# Patient Record
Sex: Male | Born: 1945 | Race: White | Hispanic: No
Health system: Southern US, Community
[De-identification: ages and names within clinical notes are randomized; demographics above are authoritative.]

## PROBLEM LIST (undated history)

## (undated) DIAGNOSIS — I498 Other specified cardiac arrhythmias: Secondary | ICD-10-CM

## (undated) DIAGNOSIS — I10 Essential (primary) hypertension: Secondary | ICD-10-CM

## (undated) DIAGNOSIS — R55 Syncope and collapse: Secondary | ICD-10-CM

## (undated) DIAGNOSIS — R600 Localized edema: Secondary | ICD-10-CM

## (undated) HISTORY — DX: Syncope and collapse: R55

## (undated) HISTORY — DX: Other specified cardiac arrhythmias: I49.8

## (undated) HISTORY — DX: Essential (primary) hypertension: I10

## (undated) HISTORY — DX: Localized edema: R60.0

---

## 2008-02-04 ENCOUNTER — Ambulatory Visit (HOSPITAL_COMMUNITY): Admission: RE | Admit: 2008-02-04 | Discharge: 2008-02-04 | Payer: Self-pay | Admitting: Internal Medicine

## 2008-02-11 ENCOUNTER — Ambulatory Visit (HOSPITAL_COMMUNITY): Admission: RE | Admit: 2008-02-11 | Discharge: 2008-02-11 | Payer: Self-pay | Admitting: Internal Medicine

## 2009-03-21 ENCOUNTER — Encounter (INDEPENDENT_AMBULATORY_CARE_PROVIDER_SITE_OTHER): Payer: Self-pay | Admitting: Internal Medicine

## 2009-03-21 ENCOUNTER — Ambulatory Visit: Admission: RE | Admit: 2009-03-21 | Discharge: 2009-03-21 | Payer: Self-pay | Admitting: Internal Medicine

## 2009-03-21 ENCOUNTER — Ambulatory Visit: Payer: Self-pay | Admitting: Vascular Surgery

## 2010-02-12 ENCOUNTER — Ambulatory Visit (HOSPITAL_COMMUNITY): Admission: RE | Admit: 2010-02-12 | Discharge: 2010-02-12 | Payer: Self-pay | Admitting: Internal Medicine

## 2010-12-19 IMAGING — CR DG ELBOW COMPLETE 3+V*R*
4 series · 4 of 4 positions shown · non-contrast
Comparison: None.

CLINICAL DATA: Swelling right elbow pain

RIGHT ELBOW - COMPLETE 3+ VIEW

[view not recorded (1 of 4)]
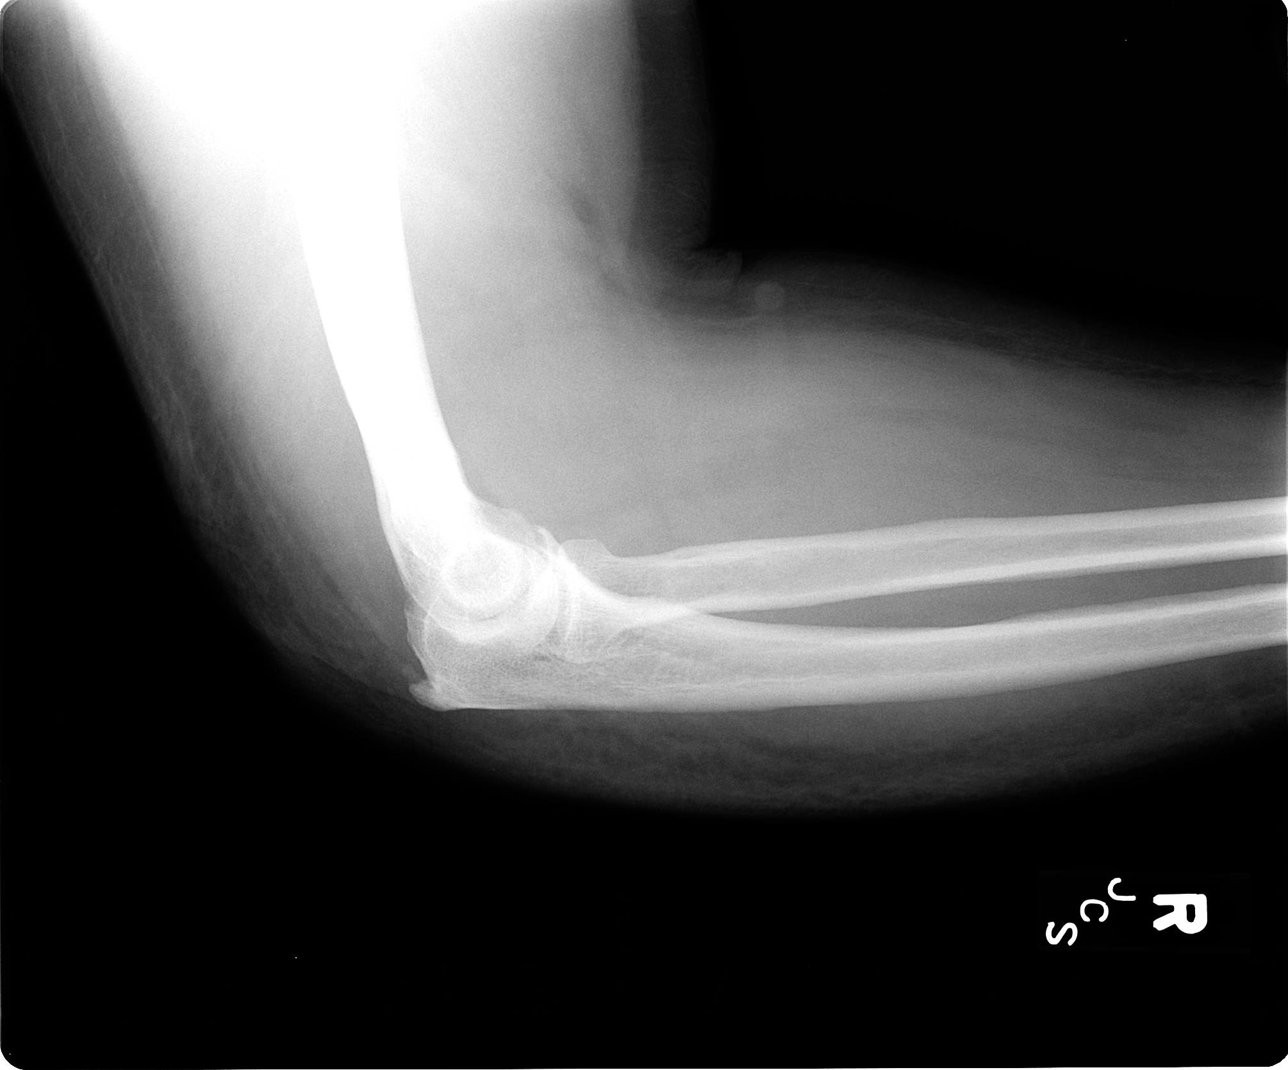

[view not recorded (2 of 4)]
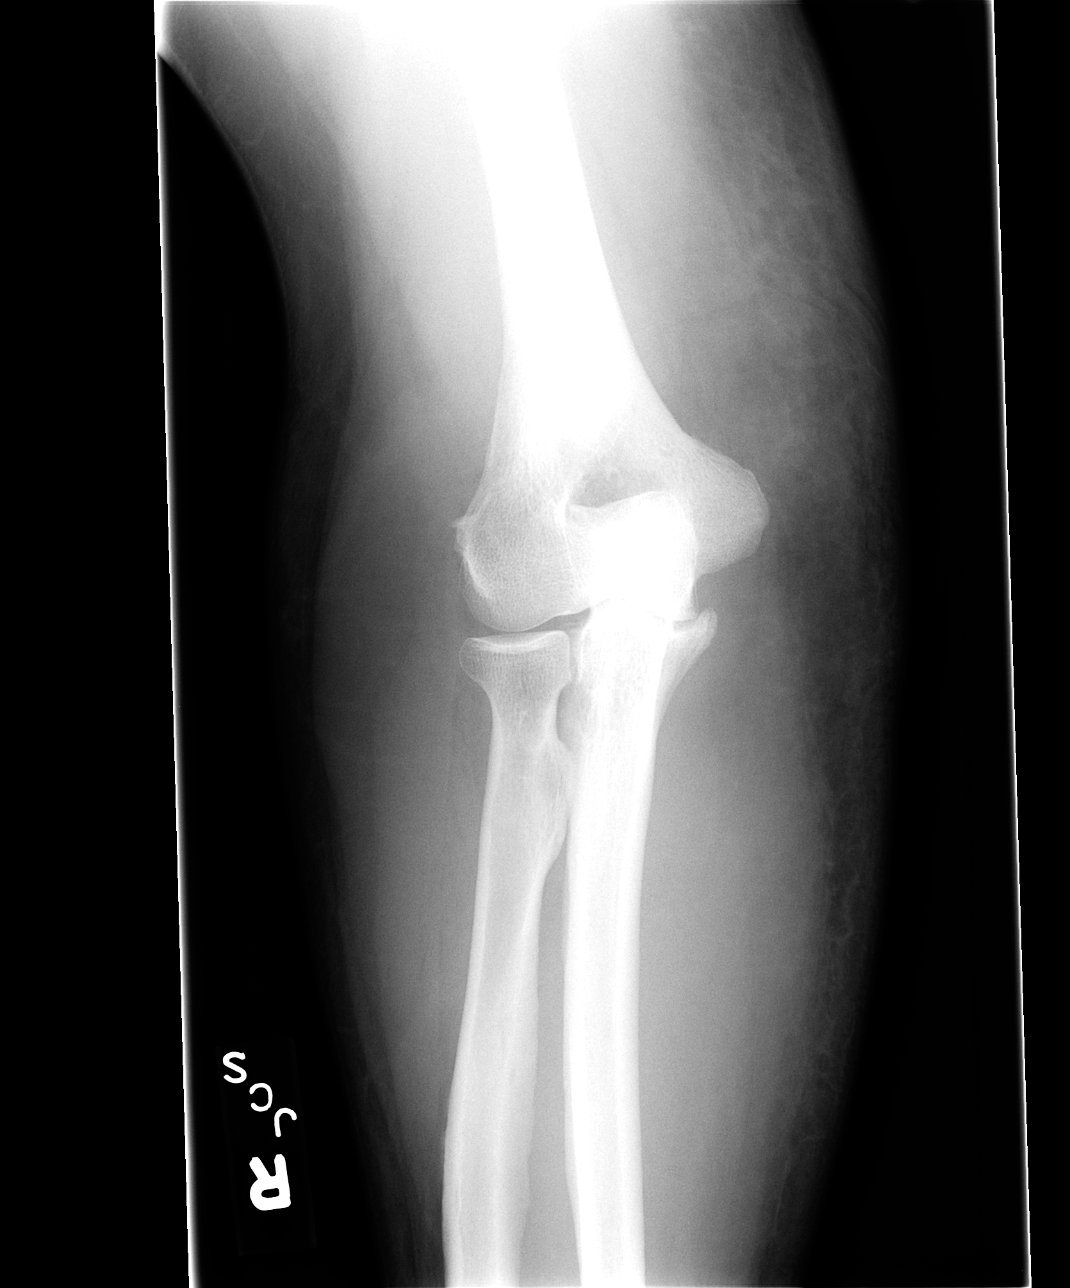

[view not recorded (3 of 4)]
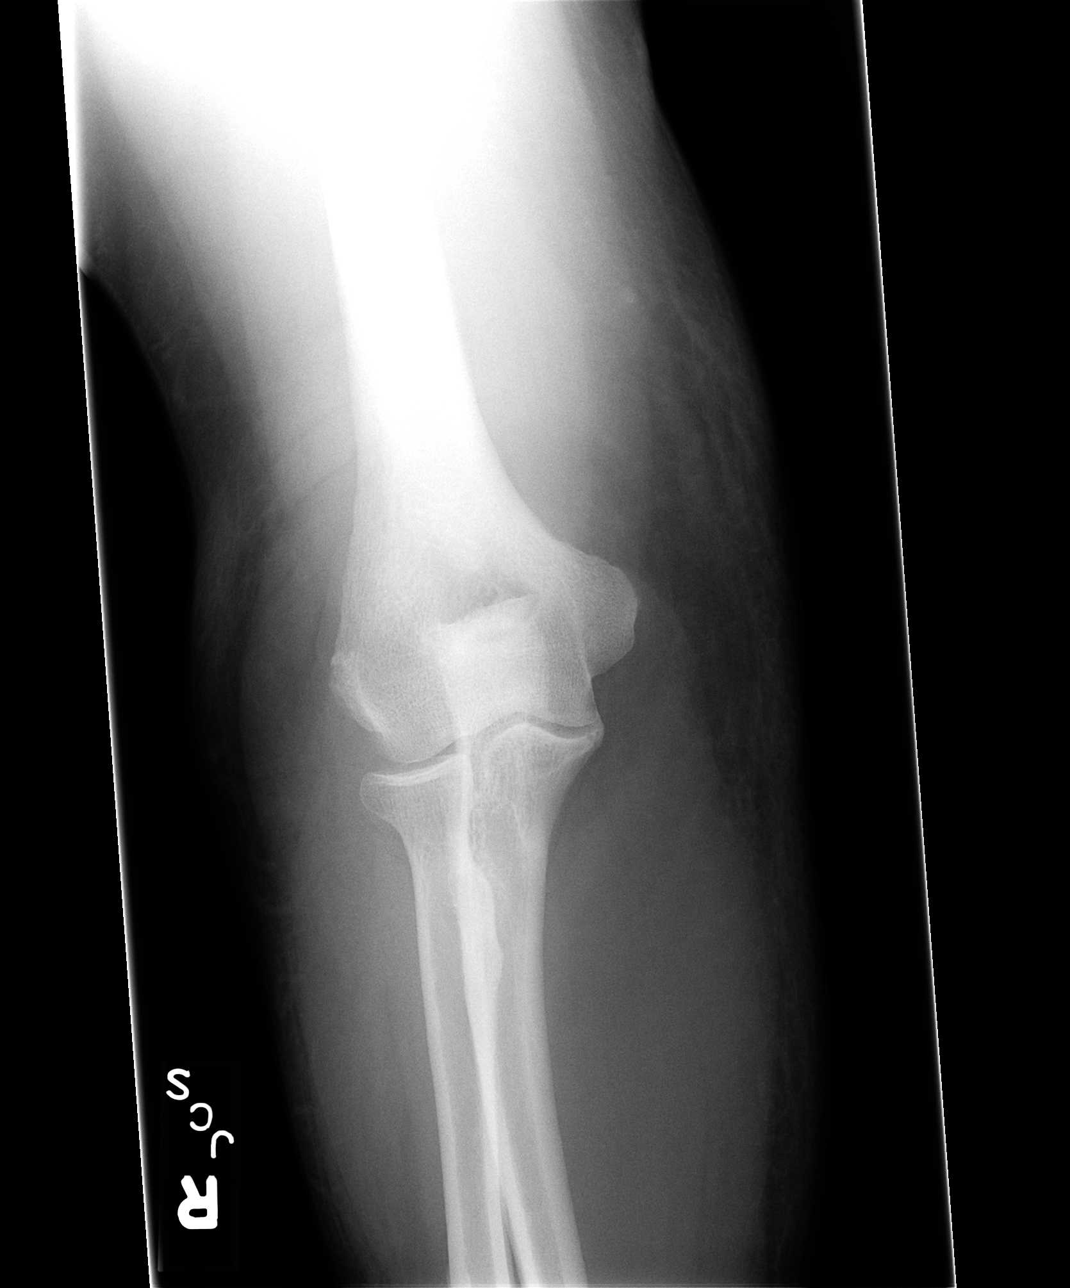

[view not recorded (4 of 4)]
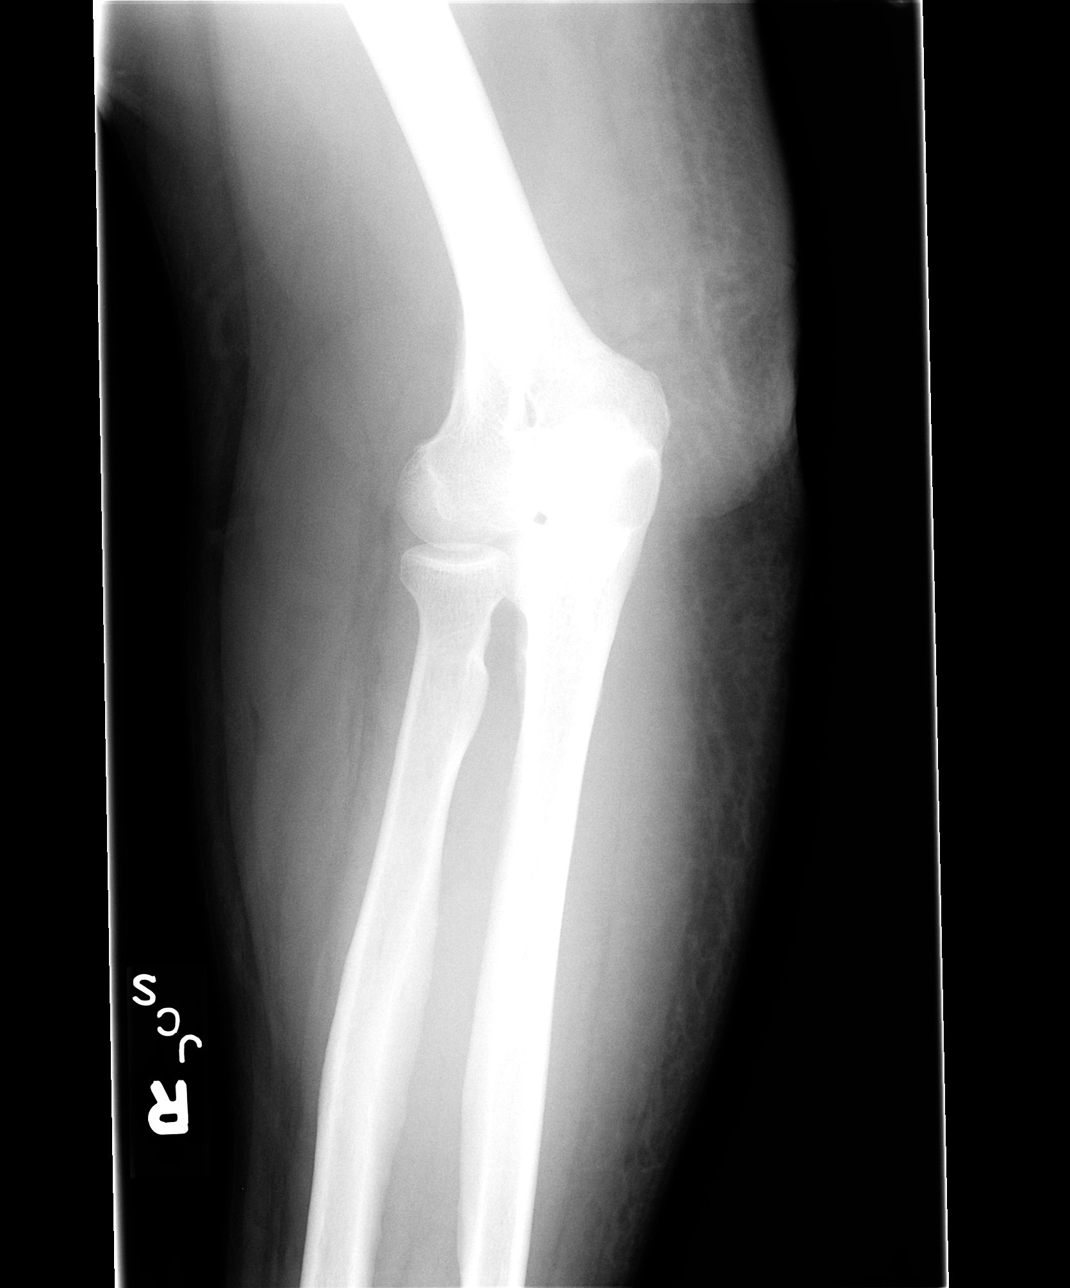

[4 of 4 positions shown; findings below may reference images not displayed]

FINDINGS: Soft tissue edema is noted posteriorly over the elbow.
No underlying bony destruction or fracture/dislocation.  No
radiopaque foreign body.
IMPRESSION: Soft tissue swelling posteriorly without underlying bony
abnormality.

## 2011-05-22 ENCOUNTER — Encounter: Payer: Self-pay | Admitting: Internal Medicine

## 2011-05-27 ENCOUNTER — Encounter: Payer: Self-pay | Admitting: Internal Medicine

## 2011-05-27 ENCOUNTER — Encounter: Payer: Self-pay | Admitting: *Deleted

## 2011-05-27 ENCOUNTER — Ambulatory Visit (INDEPENDENT_AMBULATORY_CARE_PROVIDER_SITE_OTHER): Payer: BC Managed Care – PPO | Admitting: Internal Medicine

## 2011-05-27 DIAGNOSIS — R600 Localized edema: Secondary | ICD-10-CM | POA: Insufficient documentation

## 2011-05-27 DIAGNOSIS — R0989 Other specified symptoms and signs involving the circulatory and respiratory systems: Secondary | ICD-10-CM

## 2011-05-27 DIAGNOSIS — I872 Venous insufficiency (chronic) (peripheral): Secondary | ICD-10-CM

## 2011-05-27 DIAGNOSIS — I498 Other specified cardiac arrhythmias: Secondary | ICD-10-CM

## 2011-05-27 DIAGNOSIS — R0600 Dyspnea, unspecified: Secondary | ICD-10-CM

## 2011-05-27 DIAGNOSIS — R609 Edema, unspecified: Secondary | ICD-10-CM

## 2011-05-27 DIAGNOSIS — R55 Syncope and collapse: Secondary | ICD-10-CM

## 2011-05-27 DIAGNOSIS — R0609 Other forms of dyspnea: Secondary | ICD-10-CM

## 2011-05-27 NOTE — Patient Instructions (Signed)
Your physician recommends that you schedule a follow-up appointment in: 6 WEEKS  Your physician has requested that you have an echocardiogram. Echocardiography is a painless test that uses sound waves to create images of your heart. It provides your doctor with information about the size and shape of your heart and how well your heart's chambers and valves are working. This procedure takes approximately one hour. There are no restrictions for this procedure.   Your physician has requested that you have a lower extremity venous duplex. This test is an ultrasound of the veins in the legs or arms. It looks at venous blood flow that carries blood from the heart to the legs or arms. Allow one hour for a Lower Venous exam. Allow thirty minutes for an Upper Venous exam. There are no restrictions or special instructions.  Your physician has recommended that you wear an event monitor. Event monitors are medical devices that record the heart's electrical activity. Doctors most often [place] these monitors to diagnose arrhythmias. Arrhythmias are problems with the speed or rhythm of the heartbeat. The monitor is a small, portable device. You can wear one while you do your normal daily activities. This is usually used to diagnose what is causing palpitations/syncope (passing out).

## 2011-05-27 NOTE — Assessment & Plan Note (Signed)
His electrocardiogram shows junctional rhythm interestingly at rates faster than sinus rhythm. This is suggested as accelerated and spontaneously generated. The mechanism of this is not clear. He is having dyspnea. He has some potential for coronary artery disease with his risk factors. We will undertake a echo first and then have a low threshold for pursuing a Myoview to look for evidence of a primary myocardial disease for which this junctional rhythm might emanate. It is not clearly symptomatic. No direct therapy at this time

## 2011-05-27 NOTE — Progress Notes (Signed)
HPI: Jason Andersen is a 65 y.o. male Seen at the request of [place] adult in internal medicine because of the unusual spell where he was briefly unresponsive and dropped his wife's Ipad  and had an abnormal ECG  He denies significant change in his functional status. He is morbidly obese. He has chronic dyspnea on exertion which is not mildly limiting. He denies nocturnal dyspnea. He has had peripheral edema over the last 6 months which has been largely asymmetric left greater than right. No evaluation of this has been undertaken here today to.  He's had no palpitations. He's had no syncope.  He has significant daytime somnolence and obstructive breathing patterns and a sleep study is pending.  His cardiac risk factors are notable for hypertension diabetes dyslipidemia Current Outpatient Prescriptions  Medication Sig Dispense Refill  . allopurinol (ZYLOPRIM) 300 MG tablet Take 300 mg by mouth daily.        Marland Kitchen aspirin 81 MG tablet Take 81 mg by mouth daily.        . cholecalciferol (VITAMIN D) 1000 UNITS tablet Take 1,000 Units by mouth daily.        . colchicine 0.6 MG tablet Take 0.6 mg by mouth daily.        . furosemide (LASIX) 40 MG tablet Take 40 mg by mouth 2 (two) times daily.        . Magnesium 250 MG TABS Take 1 tablet by mouth daily.        . metFORMIN (GLUCOPHAGE) 500 MG tablet Take 500 mg by mouth 2 (two) times daily with a meal.        . pravastatin (PRAVACHOL) 40 MG tablet Take 40 mg by mouth daily.          No Known Allergies  Past Medical History  Diagnosis Date  . Syncope   . HTN (hypertension)   . Hypoglycemia   . Vitamin D deficiency   . Gout     History reviewed. No pertinent past surgical history. neg  No family history on file.  Fhx neg for CAD  History   Social History  . Marital Status: Married    Spouse Name: N/A    Number of Children: N/A  . Years of Education: N/A   Occupational History  . Not on file.   Social History Main Topics  .  Smoking status: Never Smoker   . Smokeless tobacco: Not on file  . Alcohol Use: Not on file  . Drug Use: Not on file  . Sexually Active: Not on file   Other Topics Concern  . Not on file   Social History Narrative  . No narrative on file   Social history he is married has 4 children 2 grandchildren he does not use cigarettes or drugs he does drink alcohol rarely. He is a Engineer, site  Fourteen point review of systems was negative except as noted in HPI and PMH   PHYSICAL EXAMINATION  Blood pressure 153/98, pulse 67, height 5\' 11"  (1.803 m), weight 339 lb (153.769 kg).   Well developed and morbidly obese older Caucasian male appearing his stated age in no acute distress HENT normal Neck supple with JVPnondiscernible Carotids brisk and full without bruits Back without scoliosis or kyphosis Clear Irregular rate and rhythm, no murmurs or gallops Abd-soft with active BS without hepatomegaly or midline pulsation Femoral pulses 2+ distal pulses intact No Clubbing cyanosis;He is 3+ to 4+ edema on the left leg up to his knee there  is 1-2+ edema on the right. Significant left leg enlargement left compared to right below the knee but not above the knee Skin-warm and dry LN-neg submandibular and supraclavicular; no inguinal nodes A & Oriented CN 3-12 normal  Grossly normal sensory and motor function Affect engaging . Multiple ECGs were reviewed. They demonstrated junctional rhythm with a PR interval of approximately 80-100 ms and the vector quite distinct from sinus rhythm. The rate of this tracing was 64 he rate on the sinus tracing at 59 and a normal P-wave vector T-wave inversions in the inferolateral leads with intervals of 0.13/0.09/0.40.

## 2011-05-27 NOTE — Assessment & Plan Note (Signed)
This is a striking and new issue. I don't know whether he had phlebitis and has developed venous incompetence or has venous clots. We'll undertake ultrasound of this leg. The fact that there is no significant swelling of the proximal leg suggests that the obstruction should not be in the pelvic floor thus scanning of the pelvis will be deferred

## 2011-05-27 NOTE — Assessment & Plan Note (Signed)
I am not sure of the mechanism of this spell. Was brief there was no loss of postural tone. Given the junctional rhythm however, as he is reasonable to use a 30 day outpatient telemetry monitor to see if there any significant untoward events that can be elucidated that might have contributed to this episode.

## 2011-06-06 ENCOUNTER — Encounter (INDEPENDENT_AMBULATORY_CARE_PROVIDER_SITE_OTHER): Payer: BC Managed Care – PPO | Admitting: Cardiology

## 2011-06-06 ENCOUNTER — Ambulatory Visit (INDEPENDENT_AMBULATORY_CARE_PROVIDER_SITE_OTHER): Payer: BC Managed Care – PPO

## 2011-06-06 ENCOUNTER — Ambulatory Visit (HOSPITAL_COMMUNITY): Payer: Medicare Other | Attending: Cardiovascular Disease

## 2011-06-06 DIAGNOSIS — M7989 Other specified soft tissue disorders: Secondary | ICD-10-CM | POA: Insufficient documentation

## 2011-06-06 DIAGNOSIS — I059 Rheumatic mitral valve disease, unspecified: Secondary | ICD-10-CM | POA: Insufficient documentation

## 2011-06-06 DIAGNOSIS — R9431 Abnormal electrocardiogram [ECG] [EKG]: Secondary | ICD-10-CM

## 2011-06-06 DIAGNOSIS — R55 Syncope and collapse: Secondary | ICD-10-CM | POA: Insufficient documentation

## 2011-06-06 DIAGNOSIS — R0602 Shortness of breath: Secondary | ICD-10-CM

## 2011-06-06 DIAGNOSIS — I872 Venous insufficiency (chronic) (peripheral): Secondary | ICD-10-CM

## 2011-06-06 DIAGNOSIS — R0600 Dyspnea, unspecified: Secondary | ICD-10-CM

## 2011-07-03 ENCOUNTER — Ambulatory Visit: Payer: BC Managed Care – PPO | Admitting: Internal Medicine

## 2011-08-06 ENCOUNTER — Encounter: Payer: Self-pay | Admitting: Internal Medicine

## 2011-08-15 ENCOUNTER — Encounter: Payer: Self-pay | Admitting: *Deleted

## 2012-12-03 DIAGNOSIS — G4733 Obstructive sleep apnea (adult) (pediatric): Secondary | ICD-10-CM | POA: Insufficient documentation

## 2012-12-03 DIAGNOSIS — I498 Other specified cardiac arrhythmias: Secondary | ICD-10-CM | POA: Insufficient documentation

## 2013-04-12 DIAGNOSIS — Z87891 Personal history of nicotine dependence: Secondary | ICD-10-CM | POA: Insufficient documentation

## 2013-04-13 DIAGNOSIS — E7800 Pure hypercholesterolemia, unspecified: Secondary | ICD-10-CM | POA: Insufficient documentation

## 2013-04-13 DIAGNOSIS — M109 Gout, unspecified: Secondary | ICD-10-CM | POA: Insufficient documentation

## 2019-09-02 ENCOUNTER — Ambulatory Visit (INDEPENDENT_AMBULATORY_CARE_PROVIDER_SITE_OTHER)
Admission: EM | Admit: 2019-09-02 | Discharge: 2019-09-02 | Disposition: A | Discharge status: Home / Self Care | Payer: Medicare HMO | Source: Home / Self Care

## 2019-09-02 ENCOUNTER — Inpatient Hospital Stay (HOSPITAL_COMMUNITY)
Admission: EM | Admit: 2019-09-02 | Discharge: 2019-09-04 | DRG: 638 | Disposition: A | Discharge status: Home / Self Care | Payer: Medicare HMO | Attending: Internal Medicine | Admitting: Internal Medicine

## 2019-09-02 ENCOUNTER — Other Ambulatory Visit: Payer: Self-pay

## 2019-09-02 ENCOUNTER — Encounter (HOSPITAL_COMMUNITY): Payer: Self-pay | Admitting: Emergency Medicine

## 2019-09-02 DIAGNOSIS — Z7982 Long term (current) use of aspirin: Secondary | ICD-10-CM

## 2019-09-02 DIAGNOSIS — M109 Gout, unspecified: Secondary | ICD-10-CM | POA: Diagnosis present

## 2019-09-02 DIAGNOSIS — E1165 Type 2 diabetes mellitus with hyperglycemia: Secondary | ICD-10-CM | POA: Diagnosis not present

## 2019-09-02 DIAGNOSIS — N179 Acute kidney failure, unspecified: Secondary | ICD-10-CM | POA: Diagnosis present

## 2019-09-02 DIAGNOSIS — E785 Hyperlipidemia, unspecified: Secondary | ICD-10-CM | POA: Diagnosis present

## 2019-09-02 DIAGNOSIS — E1100 Type 2 diabetes mellitus with hyperosmolarity without nonketotic hyperglycemic-hyperosmolar coma (NKHHC): Secondary | ICD-10-CM

## 2019-09-02 DIAGNOSIS — Z20822 Contact with and (suspected) exposure to covid-19: Secondary | ICD-10-CM | POA: Diagnosis present

## 2019-09-02 DIAGNOSIS — E559 Vitamin D deficiency, unspecified: Secondary | ICD-10-CM | POA: Diagnosis present

## 2019-09-02 DIAGNOSIS — E861 Hypovolemia: Secondary | ICD-10-CM | POA: Diagnosis present

## 2019-09-02 DIAGNOSIS — E1159 Type 2 diabetes mellitus with other circulatory complications: Secondary | ICD-10-CM | POA: Diagnosis present

## 2019-09-02 DIAGNOSIS — E1169 Type 2 diabetes mellitus with other specified complication: Secondary | ICD-10-CM | POA: Diagnosis present

## 2019-09-02 DIAGNOSIS — E1110 Type 2 diabetes mellitus with ketoacidosis without coma: Principal | ICD-10-CM | POA: Diagnosis present

## 2019-09-02 DIAGNOSIS — Z7984 Long term (current) use of oral hypoglycemic drugs: Secondary | ICD-10-CM

## 2019-09-02 DIAGNOSIS — Z79899 Other long term (current) drug therapy: Secondary | ICD-10-CM

## 2019-09-02 DIAGNOSIS — I10 Essential (primary) hypertension: Secondary | ICD-10-CM | POA: Diagnosis present

## 2019-09-02 DIAGNOSIS — I959 Hypotension, unspecified: Secondary | ICD-10-CM | POA: Diagnosis present

## 2019-09-02 DIAGNOSIS — R5383 Other fatigue: Secondary | ICD-10-CM

## 2019-09-02 LAB — CBC WITH DIFFERENTIAL/PLATELET
Abs Immature Granulocytes: 0.08 10*3/uL — ABNORMAL HIGH (ref 0.00–0.07)
Basophils Absolute: 0.1 10*3/uL (ref 0.0–0.1)
Basophils Relative: 1 %
Eosinophils Absolute: 0.2 10*3/uL (ref 0.0–0.5)
Eosinophils Relative: 1 %
HCT: 48.8 % (ref 39.0–52.0)
Hemoglobin: 16 g/dL (ref 13.0–17.0)
Immature Granulocytes: 1 %
Lymphocytes Relative: 11 %
Lymphs Abs: 1.6 10*3/uL (ref 0.7–4.0)
MCH: 30.4 pg (ref 26.0–34.0)
MCHC: 32.8 g/dL (ref 30.0–36.0)
MCV: 92.8 fL (ref 80.0–100.0)
Monocytes Absolute: 0.9 10*3/uL (ref 0.1–1.0)
Monocytes Relative: 7 %
Neutro Abs: 11 10*3/uL — ABNORMAL HIGH (ref 1.7–7.7)
Neutrophils Relative %: 79 %
Platelets: 262 10*3/uL (ref 150–400)
RBC: 5.26 MIL/uL (ref 4.22–5.81)
RDW: 13.4 % (ref 11.5–15.5)
WBC: 13.8 10*3/uL — ABNORMAL HIGH (ref 4.0–10.5)
nRBC: 0 % (ref 0.0–0.2)

## 2019-09-02 LAB — URINALYSIS, ROUTINE W REFLEX MICROSCOPIC
Bacteria, UA: NONE SEEN
Bilirubin Urine: NEGATIVE
Glucose, UA: >=500 mg/dL — AB
Hgb urine dipstick: NEGATIVE
Ketones, ur: 5 mg/dL — AB
Leukocytes,Ua: NEGATIVE
Nitrite: NEGATIVE
Protein, ur: NEGATIVE mg/dL
Specific Gravity, Urine: 1.028 (ref 1.005–1.030)
pH: 5 (ref 5.0–8.0)

## 2019-09-02 LAB — CBG MONITORING, ED
Glucose-Capillary: >600 mg/dL (ref 70–99)
Glucose-Capillary: >600 mg/dL (ref 70–99)
Glucose-Capillary: >600 mg/dL (ref 70–99)
Glucose-Capillary: >600 mg/dL (ref 70–99)
Glucose-Capillary: >600 mg/dL (ref 70–99)
Glucose-Capillary: >600 mg/dL (ref 70–99)
Glucose-Capillary: 341 mg/dL — ABNORMAL HIGH (ref 70–99)

## 2019-09-02 LAB — BASIC METABOLIC PANEL WITH GFR
Anion gap: 15 (ref 5–15)
BUN: 31 mg/dL — ABNORMAL HIGH (ref 8–23)
CO2: 24 mmol/L (ref 22–32)
Calcium: 9.4 mg/dL (ref 8.9–10.3)
Chloride: 93 mmol/L — ABNORMAL LOW (ref 98–111)
Creatinine, Ser: 1.57 mg/dL — ABNORMAL HIGH (ref 0.61–1.24)
GFR calc Af Amer: 50 mL/min — ABNORMAL LOW
GFR calc non Af Amer: 43 mL/min — ABNORMAL LOW
Glucose, Bld: 626 mg/dL (ref 70–99)
Potassium: 5 mmol/L (ref 3.5–5.1)
Sodium: 132 mmol/L — ABNORMAL LOW (ref 135–145)

## 2019-09-02 LAB — COMPREHENSIVE METABOLIC PANEL
ALT: 32 U/L (ref 0–44)
AST: 24 U/L (ref 15–41)
Albumin: 3.9 g/dL (ref 3.5–5.0)
Alkaline Phosphatase: 141 U/L — ABNORMAL HIGH (ref 38–126)
Anion gap: 16 — ABNORMAL HIGH (ref 5–15)
BUN: 32 mg/dL — ABNORMAL HIGH (ref 8–23)
CO2: 23 mmol/L (ref 22–32)
Calcium: 9.4 mg/dL (ref 8.9–10.3)
Chloride: 85 mmol/L — ABNORMAL LOW (ref 98–111)
Creatinine, Ser: 1.69 mg/dL — ABNORMAL HIGH (ref 0.61–1.24)
GFR calc Af Amer: 46 mL/min — ABNORMAL LOW (ref >60)
GFR calc non Af Amer: 39 mL/min — ABNORMAL LOW (ref >60)
Glucose, Bld: 1029 mg/dL (ref 70–99)
Potassium: 5.6 mmol/L — ABNORMAL HIGH (ref 3.5–5.1)
Sodium: 124 mmol/L — ABNORMAL LOW (ref 135–145)
Total Bilirubin: 1.1 mg/dL (ref 0.3–1.2)
Total Protein: 6.8 g/dL (ref 6.5–8.1)

## 2019-09-02 LAB — GLUCOSE, CAPILLARY
Glucose-Capillary: 193 mg/dL — ABNORMAL HIGH (ref 70–99)
Glucose-Capillary: 280 mg/dL — ABNORMAL HIGH (ref 70–99)

## 2019-09-02 LAB — POCT FASTING CBG KUC MANUAL ENTRY: POCT Glucose (KUC): 600 mg/dL — AB (ref 70–99)

## 2019-09-02 MED ORDER — SODIUM CHLORIDE 0.9 % IV BOLUS
1000.0000 mL | Freq: Once | INTRAVENOUS | Status: AC
  Administered 2019-09-02: 1000 mL via INTRAVENOUS

## 2019-09-02 MED ORDER — INSULIN REGULAR(HUMAN) IN NACL 100-0.9 UT/100ML-% IV SOLN
INTRAVENOUS | Status: DC
  Administered 2019-09-02: 7 [IU]/h via INTRAVENOUS
  Filled 2019-09-02: qty 100

## 2019-09-02 MED ORDER — ENOXAPARIN SODIUM 40 MG/0.4ML ~~LOC~~ SOLN
40.0000 mg | Freq: Every day | SUBCUTANEOUS | Status: DC
  Administered 2019-09-02 – 2019-09-03 (×2): 40 mg via SUBCUTANEOUS
  Filled 2019-09-02 (×2): qty 0.4

## 2019-09-02 MED ORDER — INSULIN REGULAR(HUMAN) IN NACL 100-0.9 UT/100ML-% IV SOLN
INTRAVENOUS | Status: DC
  Administered 2019-09-02: 13 [IU]/h via INTRAVENOUS
  Filled 2019-09-02: qty 100

## 2019-09-02 MED ORDER — SODIUM CHLORIDE 0.9 % IV SOLN: INTRAVENOUS | Status: DC

## 2019-09-02 MED ORDER — DEXTROSE 50 % IV SOLN: 0.0000 mL | INTRAVENOUS | Status: DC | PRN

## 2019-09-02 MED ORDER — PRAVASTATIN SODIUM 40 MG PO TABS
40.0000 mg | ORAL_TABLET | Freq: Every day | ORAL | Status: DC
  Administered 2019-09-02 – 2019-09-03 (×2): 40 mg via ORAL
  Filled 2019-09-02 (×3): qty 1

## 2019-09-02 MED ORDER — DEXTROSE-NACL 5-0.45 % IV SOLN: INTRAVENOUS | Status: DC

## 2019-09-02 NOTE — ED Notes (Addendum)
Date and time results received: 09/02/19 2119 (use smartphrase ".now" to insert current time)  Test: Glucose Critical Value: 626  Name of Provider Notified: Lama/Petrucelli  Orders Received? Or Actions Taken?:

## 2019-09-02 NOTE — ED Triage Notes (Signed)
Sent by Urgent care CBG >600.  CBG in triage HI.  Reports having thirstiness, poor food intake, weakness and urinary frequency.

## 2019-09-02 NOTE — ED Notes (Signed)
Per wife,pt "is a [place] Vet with PTSD whom is "terrified of hospitals. He will not volunteer this information but will give information when asked." Pt is also on CPAP at home.

## 2019-09-02 NOTE — ED Triage Notes (Signed)
Patient states that over last 10 days appetite has decreased, increased thirst, weakness, fell a couple times, some dizziness one day.

## 2019-09-02 NOTE — ED Notes (Signed)
Date and time results received: 09/02/19 6:44 PM  (use smartphrase ".now" to insert current time)  Test: glucose Critical Value: 1029  Name of Provider Notified: Petrucelli PA  Orders Received? Or Actions Taken?: na

## 2019-09-02 NOTE — H&P (Signed)
TRH H&P    Patient Demographics:    Jason Andersen, is a 74 y.o. male  MRN: [id number]  DOB - 10-31-1945  Admit Date - 09/02/2019  Referring MD/NP/PA: Kennith Maes  Outpatient Primary MD for the patient is Berkley Harvey, NP  Patient coming from: Home  Chief complaint-dizziness   HPI:    Jason Andersen  is a 74 y.o. male, with history of diabetes mellitus type 2, hypertension, vitamin D deficiency came to hospital with chief complaints of dizziness, hyperglycemia.  Patient says that for about a week he has experienced generalized weakness, polyuria, polydipsia and also felt lightheaded on walking.  He went to urgent care today and was found to have blood glucose greater than 600 and he was sent to the ED for further evaluation.  Patient takes Metformin 2000 mg p.o. daily.  He has not seen his PCP in couple of years.  He denies fever, chills, upper respiratory symptoms,.  Denies nausea vomiting or diarrhea.  Denies abdominal pain.  Denies dysuria. In the ED patient was found to have blood glucose of 1029, anion gap 16, creatinine 1.69, sodium 124, potassium 5.6. Patient started on IV insulin, glucose stabilizer.    Review of systems:    In addition to the HPI above,   All other systems reviewed and are negative.    Past History of the following :    Past Medical History:  Diagnosis Date  . Diabetes mellitus   . Edema leg-Asymmetric    Left greater than right  . Gout   . HTN (hypertension)   . Junctional rhythm    Accelerated  . Syncope   . Vitamin D deficiency         Social History:      Social History   Tobacco Use  . Smoking status: Never Smoker  . Smokeless tobacco: Never Used  Substance Use Topics  . Alcohol use: Not on file       Family History :     Family History  Problem Relation Age of Onset  . Healthy Mother   . Healthy Father       Home Medications:     Prior to Admission medications   Medication Sig Start Date End Date Taking? Authorizing Provider  allopurinol (ZYLOPRIM) 300 MG tablet Take 300 mg by mouth daily.     Yes [provider]  aspirin 81 MG tablet Take 81 mg by mouth daily.     Yes [provider]  cholecalciferol (VITAMIN D) 1000 UNITS tablet Take 1,000 Units by mouth daily.     Yes [provider]  colchicine 0.6 MG tablet Take 0.6 mg by mouth daily.     Yes [provider]  furosemide (LASIX) 40 MG tablet Take 40 mg by mouth 2 (two) times daily as needed for fluid.    Yes [provider]  lisinopril (ZESTRIL) 10 MG tablet TAKE 1 TABLET EVERY DAY 02/10/17  Yes [provider]  loperamide (IMODIUM) 2 MG capsule Take by mouth.   Yes [provider]  metFORMIN (GLUCOPHAGE-XR) 500 MG 24 hr tablet Take 1,000 mg by mouth 2 (two) times daily. 07/13/19  Yes [provider]  Multiple Vitamins-Minerals (MULTIVITAL PO) Take 1 tablet by mouth daily.   Yes [provider]  pravastatin (PRAVACHOL) 40 MG tablet Take 40 mg by mouth daily.     Yes [provider]  Magnesium 250 MG TABS Take 1 tablet by mouth daily.      [provider]  metFORMIN (GLUCOPHAGE) 500 MG tablet Take 1,000 mg by mouth 2 (two) times daily with a meal.     [provider]     Allergies:    No Known Allergies   Physical Exam:   Vitals  Blood pressure 102/64, pulse 75, temperature 97.9 F (36.6 C), temperature source Oral, resp. rate 13, height 5\' 10"  (1.778 m), weight 122.5 kg, SpO2 99 %.  1.  General: Appears in no acute distress  2. Psychiatric: Alert, oriented x3, intact insight and judgment  3. Neurologic: Cranial nerves II through XII grossly intact, motor strength 5/5 in all extremities  4. HEENMT:  Atraumatic normocephalic, extraocular muscles are intact  5. Respiratory : Clear to auscultation bilaterally, no wheezing or crackles  auscultated  6. Cardiovascular : S1-S2, regular, no murmur auscultated  7. Gastrointestinal:  Abdomen is soft, nontender, no organomegaly     Data Review:    CBC Recent Labs  Lab 09/02/19 1655  WBC 13.8*  HGB 16.0  HCT 48.8  PLT 262  MCV 92.8  MCH 30.4  MCHC 32.8  RDW 13.4  LYMPHSABS 1.6  MONOABS 0.9  EOSABS 0.2  BASOSABS 0.1   ------------------------------------------------------------------------------------------------------------------  Results for orders placed or performed during the hospital encounter of 09/02/19 (from the past 48 hour(s))  CBG monitoring, ED     Status: Abnormal   Collection Time: 09/02/19  3:18 PM  Result Value Ref Range   Glucose-Capillary >600 (HH) 70 - 99 mg/dL  Comprehensive metabolic panel     Status: Abnormal   Collection Time: 09/02/19  4:55 PM  Result Value Ref Range   Sodium 124 (L) 135 - 145 mmol/L   Potassium 5.6 (H) 3.5 - 5.1 mmol/L   Chloride 85 (L) 98 - 111 mmol/L   CO2 23 22 - 32 mmol/L   Glucose, Bld 1,029 (HH) 70 - 99 mg/dL    Comment: CRITICAL RESULT CALLED TO, READ BACK BY AND VERIFIED WITH: ELLIS,C ON 09/02/19 AT 1845 BY LOY,C    BUN 32 (H) 8 - 23 mg/dL   Creatinine, Ser 10/31/19 (H) 0.61 - 1.24 mg/dL   Calcium 9.4 8.9 - 7.89 mg/dL   Total Protein 6.8 6.5 - 8.1 g/dL   Albumin 3.9 3.5 - 5.0 g/dL   AST 24 15 - 41 U/L   ALT 32 0 - 44 U/L   Alkaline Phosphatase 141 (H) 38 - 126 U/L   Total Bilirubin 1.1 0.3 - 1.2 mg/dL   GFR calc non Af Amer 39 (L) >60 mL/min   GFR calc Af Amer 46 (L) >60 mL/min   Anion gap 16 (H) 5 - 15    Comment: Performed at [facility], [address] [place], [place]  CBC with Differential     Status: Abnormal   Collection Time: 09/02/19  4:55 PM  Result Value Ref Range   WBC 13.8 (H) 4.0 - 10.5 K/uL   RBC 5.26 4.22 - 5.81 MIL/uL   Hemoglobin 16.0 13.0 - 17.0 g/dL   HCT  48.8 39.0 - 52.0 %   MCV 92.8 80.0 - 100.0 fL   MCH 30.4 26.0 - 34.0 pg   MCHC 32.8 30.0 - 36.0 g/dL   RDW  32.3 55.7 - 32.2 %   Platelets 262 150 - 400 K/uL   nRBC 0.0 0.0 - 0.2 %   Neutrophils Relative % 79 %   Neutro Abs 11.0 (H) 1.7 - 7.7 K/uL   Lymphocytes Relative 11 %   Lymphs Abs 1.6 0.7 - 4.0 K/uL   Monocytes Relative 7 %   Monocytes Absolute 0.9 0.1 - 1.0 K/uL   Eosinophils Relative 1 %   Eosinophils Absolute 0.2 0.0 - 0.5 K/uL   Basophils Relative 1 %   Basophils Absolute 0.1 0.0 - 0.1 K/uL   Immature Granulocytes 1 %   Abs Immature Granulocytes 0.08 (H) 0.00 - 0.07 K/uL    Comment: Performed at [facility], [address], [place], [place] [address]  Urinalysis, Routine w reflex microscopic     Status: Abnormal   Collection Time: 09/02/19  5:25 PM  Result Value Ref Range   Color, Urine STRAW (A) YELLOW   APPearance CLEAR CLEAR   Specific Gravity, Urine 1.028 1.005 - 1.030   pH 5.0 5.0 - 8.0   Glucose, UA >=500 (A) NEGATIVE mg/dL   Hgb urine dipstick NEGATIVE NEGATIVE   Bilirubin Urine NEGATIVE NEGATIVE   Ketones, ur 5 (A) NEGATIVE mg/dL   Protein, ur NEGATIVE NEGATIVE mg/dL   Nitrite NEGATIVE NEGATIVE   Leukocytes,Ua NEGATIVE NEGATIVE   RBC / HPF 0-5 0 - 5 RBC/hpf   WBC, UA 0-5 0 - 5 WBC/hpf   Bacteria, UA NONE SEEN NONE SEEN   Squamous Epithelial / LPF 0-5 0 - 5    Comment: Performed at [facility], [address] [place], [place] [address]  CBG monitoring, ED     Status: Abnormal   Collection Time: 09/02/19  7:27 PM  Result Value Ref Range   Glucose-Capillary >600 (HH) 70 - 99 mg/dL   Comment 1 Notify RN    Comment 2 Document in Chart   CBG monitoring, ED     Status: Abnormal   Collection Time: 09/02/19  7:59 PM  Result Value Ref Range   Glucose-Capillary >600 (HH) 70 - 99 mg/dL   Comment 1 Notify RN    Comment 2 Document in Chart     Chemistries  Recent Labs  Lab 09/02/19 1655  NA 124*  K 5.6*  CL 85*  CO2 23  GLUCOSE 1,029*  BUN 32*  CREATININE 1.69*  CALCIUM 9.4  AST 24  ALT 32  ALKPHOS 141*  BILITOT 1.1    ------------------------------------------------------------------------------------------------------------------  ------------------------------------------------------------------------------------------------------------------ GFR: Estimated Creatinine Clearance: 51.1 mL/min (A) (by C-G formula based on SCr of 1.69 mg/dL (H)). Liver Function Tests: Recent Labs  Lab 09/02/19 1655  AST 24  ALT 32  ALKPHOS 141*  BILITOT 1.1  PROT 6.8  ALBUMIN 3.9   CBG: Recent Labs  Lab 09/02/19 1518 09/02/19 1927 09/02/19 1959  GLUCAP >600* >600* >600*    --------------------------------------------------------------------------------------------------------------- Urine analysis:    Component Value Date/Time   COLORURINE STRAW (A) 09/02/2019 1725   APPEARANCEUR CLEAR 09/02/2019 1725   LABSPEC 1.028 09/02/2019 1725   PHURINE 5.0 09/02/2019 1725   GLUCOSEU >=500 (A) 09/02/2019 1725   HGBUR NEGATIVE 09/02/2019 1725   BILIRUBINUR NEGATIVE 09/02/2019 1725   KETONESUR 5 (A) 09/02/2019 1725   PROTEINUR NEGATIVE 09/02/2019 1725   NITRITE NEGATIVE 09/02/2019 1725  LEUKOCYTESUR NEGATIVE 09/02/2019 1725      Imaging Results:    No results found.  My personal review of EKG: Rhythm NSR, no ST-T changes   Assessment & Plan:    Active Problems:   DKA (diabetic ketoacidoses) (HCC)   1. Diabetic ketoacidosis-patient presenting with diabetic ketoacidosis, anion gap 16, will start on DKA protocol.  IV insulin, check BMP every 4 hours.  We will switch to D5 half-normal saline once blood glucose less than 250.  Potassium is greater than 5.  Will monitor potassium at this time. 2. Acute kidney injury-patient's creatinine is 1.69, likely acute kidney injury.  No previous baseline available.  Started on IV normal saline.  Will hold Lasix and lisinopril at this time. 3. Hypertension-blood pressure is soft, will hold Lasix and lisinopril as above.  Consider restarting these medications once  blood pressure improves and also renal function results. 4. Hyperlipidemia-continue Pravachol.    DVT Prophylaxis-   Lovenox   AM Labs Ordered, also please review Full Orders  Family Communication: Admission, patients condition and plan of care including tests being ordered have been discussed with the patient  who indicate understanding and agree with the plan and Code Status.  Code Status: Full code  Admission status: Inpatient: Based on patients clinical presentation and evaluation of above clinical data, I have made determination that patient meets Inpatient criteria at this time.  Time spent in minutes : 60 minutes   Bennett Vanscyoc S Lynnetta Tom M.D

## 2019-09-02 NOTE — ED Provider Notes (Signed)
[facility] Provider Note   CSN: [id number] Arrival date & time: 09/02/19  1453     History Chief Complaint  Patient presents with  . Hyperglycemia    Jason Andersen is a 74 y.o. male with a hx of T2DM on metformin, hypertension, & junctional rhythm who presents to the ED from [facility] for evaluation of hyperglycemia today.  Patient states he has felt poorly for about 1 week with polyuria, polydipsia, generalized weakness, and poor p.o. intake.  Feels a bit lightheaded with position changes at times.  No other alleviating or aggravating factors.  Given his symptoms he went to urgent care for further evaluation.  Given his blood sugar reading was greater than 600 they sent him to the emergency department for further assessment.  He states he takes 2000 mg of Metformin per day, has not missed any doses recently, he states he has not seen his PCP in a couple of years.  He denies fever, chills, URI symptoms, dyspnea, cough, chest pain, syncope, abdominal pain, nausea, vomiting, diarrhea, melena, hematochezia, or dysuria.  HPI     Past Medical History:  Diagnosis Date  . Diabetes mellitus   . Edema leg-Asymmetric    Left greater than right  . Gout   . HTN (hypertension)   . Junctional rhythm    Accelerated  . Syncope   . Vitamin D deficiency     Patient Active Problem List   Diagnosis Date Noted  . Edema leg-Asymmetric   . Syncope   . Junctional rhythm     History reviewed. No pertinent surgical history.     Family History  Problem Relation Age of Onset  . Healthy Mother   . Healthy Father     Social History   Tobacco Use  . Smoking status: Never Smoker  . Smokeless tobacco: Never Used  Substance Use Topics  . Alcohol use: Not on file  . Drug use: Not on file    Home Medications Prior to Admission medications   Medication Sig Start Date End Date Taking? Authorizing Provider  allopurinol (ZYLOPRIM) 300 MG tablet Take 300 mg by mouth daily.       [provider]  aspirin 81 MG tablet Take 81 mg by mouth daily.      [provider]  cholecalciferol (VITAMIN D) 1000 UNITS tablet Take 1,000 Units by mouth daily.      [provider]  colchicine 0.6 MG tablet Take 0.6 mg by mouth daily.      [provider]  furosemide (LASIX) 40 MG tablet Take 40 mg by mouth 2 (two) times daily.      [provider]  Magnesium 250 MG TABS Take 1 tablet by mouth daily.      [provider]  metFORMIN (GLUCOPHAGE) 500 MG tablet Take 500 mg by mouth 2 (two) times daily with a meal.      [provider]  pravastatin (PRAVACHOL) 40 MG tablet Take 40 mg by mouth daily.      [provider]    Allergies    Patient has no known allergies.  Review of Systems   Review of Systems  Constitutional: Positive for appetite change. Negative for chills and fever.  Respiratory: Negative for shortness of breath.   Cardiovascular: Negative for chest pain.  Gastrointestinal: Negative for abdominal pain, blood in stool, diarrhea, nausea and vomiting.  Endocrine: Positive for polydipsia and polyuria.  Genitourinary: Positive for frequency. Negative for dysuria and hematuria.  Neurological: Positive for weakness (generalized) and light-headedness (intermittent). Negative for syncope.  All other systems reviewed and are negative.   Physical Exam Updated Vital Signs BP 97/64 (BP Location: Right Arm)   Pulse (!) 113   Temp 97.9 F (36.6 C) (Oral)   Resp 16   Ht 5\' 10"  (1.778 m)   Wt 122.5 kg   SpO2 97%   BMI 38.74 kg/m   Physical Exam Vitals and nursing note reviewed.  Constitutional:      General: He is not in acute distress.    Appearance: He is well-developed. He is not toxic-appearing.  HENT:     Head: Normocephalic and atraumatic.     Mouth/Throat:     Mouth: Mucous membranes are dry.  Eyes:     General:        Right eye: No discharge.        Left eye: No discharge.      Conjunctiva/sclera: Conjunctivae normal.  Cardiovascular:     Rate and Rhythm: Regular rhythm. Tachycardia present.  Pulmonary:     Effort: Pulmonary effort is normal. No respiratory distress.     Breath sounds: Normal breath sounds. No wheezing, rhonchi or rales.  Chest:     Chest wall: No tenderness.  Abdominal:     General: There is no distension.     Palpations: Abdomen is soft.     Tenderness: There is no abdominal tenderness. There is no guarding or rebound.  Musculoskeletal:     Cervical back: Neck supple.     Right lower leg: No edema.     Left lower leg: No edema.  Skin:    General: Skin is warm and dry.     Findings: No rash.  Neurological:     Mental Status: He is alert.     Comments: Clear speech.   Psychiatric:        Behavior: Behavior normal.     ED Results / Procedures / Treatments   Labs (all labs ordered are listed, but only abnormal results are displayed) Labs Reviewed  COMPREHENSIVE METABOLIC PANEL - Abnormal; Notable for the following components:      Result Value   Sodium 124 (*)    Potassium 5.6 (*)    Chloride 85 (*)    Glucose, Bld 1,029 (*)    BUN 32 (*)    Creatinine, Ser 1.69 (*)    Alkaline Phosphatase 141 (*)    GFR calc non Af Amer 39 (*)    GFR calc Af Amer 46 (*)    Anion gap 16 (*)    All other components within normal limits  CBC WITH DIFFERENTIAL/PLATELET - Abnormal; Notable for the following components:   WBC 13.8 (*)    Neutro Abs 11.0 (*)    Abs Immature Granulocytes 0.08 (*)    All other components within normal limits  URINALYSIS, ROUTINE W REFLEX MICROSCOPIC - Abnormal; Notable for the following components:   Color, Urine STRAW (*)    Glucose, UA >=500 (*)    Ketones, ur 5 (*)    All other components within normal limits  CBG MONITORING, ED - Abnormal; Notable for the following components:   Glucose-Capillary >600 (*)    All other components within normal limits  CBG MONITORING, ED - Abnormal; Notable for the following  components:   Glucose-Capillary >600 (*)    All other components within normal limits  SARS CORONAVIRUS 2 (TAT 6-24 HRS)  OSMOLALITY   EKG None  Radiology No  results found.  Procedures .Critical Care Performed by: Cherly Anderson, PA-C Authorized by: Cherly Anderson, PA-C    CRITICAL CARE Performed by: Harvie Heck   Total critical care time: 30 minutes  Critical care time was exclusive of separately billable procedures and treating other patients.  Critical care was necessary to treat or prevent imminent or life-threatening deterioration.  Critical care was time spent personally by me on the following activities: development of treatment plan with patient and/or surrogate as well as nursing, discussions with consultants, evaluation of patient's response to treatment, examination of patient, obtaining history from patient or surrogate, ordering and performing treatments and interventions, ordering and review of laboratory studies, ordering and review of radiographic studies, pulse oximetry and re-evaluation of patient's condition.   (including critical care time)  Medications Ordered in ED Medications  insulin regular, human (MYXREDLIN) 100 units/ 100 mL infusion (13 Units/hr Intravenous New Bag/Given 09/02/19 1931)  0.9 %  sodium chloride infusion (has no administration in time range)  dextrose 5 %-0.45 % sodium chloride infusion (has no administration in time range)  dextrose 50 % solution 0-50 mL (has no administration in time range)  sodium chloride 0.9 % bolus 1,000 mL (1,000 mLs Intravenous New Bag/Given 09/02/19 1859)    ED Course  I have reviewed the triage vital signs and the nursing notes.  Pertinent labs & imaging results that were available during my care of the patient were reviewed by me and considered in my medical decision making (see chart for details).    Jason Andersen was evaluated in Emergency Department on 09/02/2019 for the  symptoms described in the history of present illness. He/she was evaluated in the context of the global COVID-19 pandemic, which necessitated consideration that the patient might be at risk for infection with the SARS-CoV-2 virus that causes COVID-19. Institutional protocols and algorithms that pertain to the evaluation of patients at risk for COVID-19 are in a state of rapid change based on information released by regulatory bodies including the CDC and federal and state organizations. These policies and algorithms were followed during the patient's care in the ED.  MDM Rules/Calculators/A&P                      Patient presents to the emergency department with polyuria, polydipsia, and generalized weakness.  Nontoxic, resting comfortably, mild tachycardia initially vitals otherwise WNL.  Lungs are clear.  Abdomen is nontender without peritoneal signs.  CBG per triage is greater than 600.  Suspect symptomatic hyperglycemia.  Concern for DKA versus HHS.  We will start fluids and check blood work.  CBC: Leukocytosis felt to be nonspecific currently.  No anemia. CMP: Significant hyperglycemia at 1029, bicarb is within normal limits, gap is very minimally elevated.  LFTs WNL Urinalysis: Glucosuria, very minimal ketonuria.  No UTI.  Lab findings overall most suspicious for HHS. Will start glucose stabilizer consult hospitalist service for admission.  19:33:CONSULT: Discussed with hospitalist Dr. Sharl Ma- Accepts admission.   Findings and plan of care discussed with supervising physician Dr. Juleen China who is in agreement.   Final Clinical Impression(s) / ED Diagnoses Final diagnoses:  Type 2 diabetes mellitus with hyperosmolar hyperglycemic state (HHS) [facility])    Rx / DC Orders ED Discharge Orders    None       Desmond Lope 09/02/19 1936    Raeford Razor, MD 09/03/19 1858

## 2019-09-03 DIAGNOSIS — I10 Essential (primary) hypertension: Secondary | ICD-10-CM

## 2019-09-03 DIAGNOSIS — N179 Acute kidney failure, unspecified: Secondary | ICD-10-CM | POA: Diagnosis present

## 2019-09-03 DIAGNOSIS — E1159 Type 2 diabetes mellitus with other circulatory complications: Secondary | ICD-10-CM | POA: Diagnosis present

## 2019-09-03 DIAGNOSIS — E1169 Type 2 diabetes mellitus with other specified complication: Secondary | ICD-10-CM | POA: Diagnosis present

## 2019-09-03 DIAGNOSIS — E785 Hyperlipidemia, unspecified: Secondary | ICD-10-CM

## 2019-09-03 LAB — BASIC METABOLIC PANEL
Anion gap: 17 — ABNORMAL HIGH (ref 5–15)
Anion gap: 13 (ref 5–15)
Anion gap: 11 (ref 5–15)
Anion gap: 9 (ref 5–15)
Anion gap: 10 (ref 5–15)
BUN: 31 mg/dL — ABNORMAL HIGH (ref 8–23)
BUN: 33 mg/dL — ABNORMAL HIGH (ref 8–23)
BUN: 35 mg/dL — ABNORMAL HIGH (ref 8–23)
BUN: 36 mg/dL — ABNORMAL HIGH (ref 8–23)
BUN: 32 mg/dL — ABNORMAL HIGH (ref 8–23)
CO2: 23 mmol/L (ref 22–32)
CO2: 23 mmol/L (ref 22–32)
CO2: 26 mmol/L (ref 22–32)
CO2: 30 mmol/L (ref 22–32)
CO2: 26 mmol/L (ref 22–32)
Calcium: 9.9 mg/dL (ref 8.9–10.3)
Calcium: 9.4 mg/dL (ref 8.9–10.3)
Calcium: 9.5 mg/dL (ref 8.9–10.3)
Calcium: 9.4 mg/dL (ref 8.9–10.3)
Calcium: 9.1 mg/dL (ref 8.9–10.3)
Chloride: 98 mmol/L (ref 98–111)
Chloride: 100 mmol/L (ref 98–111)
Chloride: 101 mmol/L (ref 98–111)
Chloride: 99 mmol/L (ref 98–111)
Chloride: 99 mmol/L (ref 98–111)
Creatinine, Ser: 1.75 mg/dL — ABNORMAL HIGH (ref 0.61–1.24)
Creatinine, Ser: 1.79 mg/dL — ABNORMAL HIGH (ref 0.61–1.24)
Creatinine, Ser: 1.79 mg/dL — ABNORMAL HIGH (ref 0.61–1.24)
Creatinine, Ser: 1.82 mg/dL — ABNORMAL HIGH (ref 0.61–1.24)
Creatinine, Ser: 1.56 mg/dL — ABNORMAL HIGH (ref 0.61–1.24)
GFR calc Af Amer: 44 mL/min — ABNORMAL LOW (ref >60)
GFR calc Af Amer: 43 mL/min — ABNORMAL LOW (ref >60)
GFR calc Af Amer: 43 mL/min — ABNORMAL LOW (ref >60)
GFR calc Af Amer: 42 mL/min — ABNORMAL LOW (ref >60)
GFR calc Af Amer: 50 mL/min — ABNORMAL LOW (ref >60)
GFR calc non Af Amer: 38 mL/min — ABNORMAL LOW (ref >60)
GFR calc non Af Amer: 37 mL/min — ABNORMAL LOW (ref >60)
GFR calc non Af Amer: 37 mL/min — ABNORMAL LOW (ref >60)
GFR calc non Af Amer: 36 mL/min — ABNORMAL LOW (ref >60)
GFR calc non Af Amer: 43 mL/min — ABNORMAL LOW (ref >60)
Glucose, Bld: 285 mg/dL — ABNORMAL HIGH (ref 70–99)
Glucose, Bld: 261 mg/dL — ABNORMAL HIGH (ref 70–99)
Glucose, Bld: 212 mg/dL — ABNORMAL HIGH (ref 70–99)
Glucose, Bld: 215 mg/dL — ABNORMAL HIGH (ref 70–99)
Glucose, Bld: 178 mg/dL — ABNORMAL HIGH (ref 70–99)
Potassium: 4 mmol/L (ref 3.5–5.1)
Potassium: 4.2 mmol/L (ref 3.5–5.1)
Potassium: 4.1 mmol/L (ref 3.5–5.1)
Potassium: 5 mmol/L (ref 3.5–5.1)
Potassium: 4.4 mmol/L (ref 3.5–5.1)
Sodium: 138 mmol/L (ref 135–145)
Sodium: 136 mmol/L (ref 135–145)
Sodium: 138 mmol/L (ref 135–145)
Sodium: 138 mmol/L (ref 135–145)
Sodium: 135 mmol/L (ref 135–145)

## 2019-09-03 LAB — GLUCOSE, CAPILLARY
Glucose-Capillary: 161 mg/dL — ABNORMAL HIGH (ref 70–99)
Glucose-Capillary: 170 mg/dL — ABNORMAL HIGH (ref 70–99)
Glucose-Capillary: 176 mg/dL — ABNORMAL HIGH (ref 70–99)
Glucose-Capillary: 178 mg/dL — ABNORMAL HIGH (ref 70–99)
Glucose-Capillary: 183 mg/dL — ABNORMAL HIGH (ref 70–99)
Glucose-Capillary: 187 mg/dL — ABNORMAL HIGH (ref 70–99)
Glucose-Capillary: 189 mg/dL — ABNORMAL HIGH (ref 70–99)
Glucose-Capillary: 193 mg/dL — ABNORMAL HIGH (ref 70–99)
Glucose-Capillary: 205 mg/dL — ABNORMAL HIGH (ref 70–99)
Glucose-Capillary: 210 mg/dL — ABNORMAL HIGH (ref 70–99)
Glucose-Capillary: 222 mg/dL — ABNORMAL HIGH (ref 70–99)
Glucose-Capillary: 239 mg/dL — ABNORMAL HIGH (ref 70–99)
Glucose-Capillary: 243 mg/dL — ABNORMAL HIGH (ref 70–99)
Glucose-Capillary: 243 mg/dL — ABNORMAL HIGH (ref 70–99)
Glucose-Capillary: 388 mg/dL — ABNORMAL HIGH (ref 70–99)

## 2019-09-03 LAB — MRSA PCR SCREENING: MRSA by PCR: NEGATIVE

## 2019-09-03 LAB — BETA-HYDROXYBUTYRIC ACID
Beta-Hydroxybutyric Acid: 0.62 mmol/L — ABNORMAL HIGH (ref 0.05–0.27)
Beta-Hydroxybutyric Acid: 0.22 mmol/L (ref 0.05–0.27)
Beta-Hydroxybutyric Acid: 0.4 mmol/L — ABNORMAL HIGH (ref 0.05–0.27)

## 2019-09-03 LAB — CBC
HCT: 46.3 % (ref 39.0–52.0)
Hemoglobin: 16.2 g/dL (ref 13.0–17.0)
MCH: 30.7 pg (ref 26.0–34.0)
MCHC: 35 g/dL (ref 30.0–36.0)
MCV: 87.9 fL (ref 80.0–100.0)
Platelets: 310 10*3/uL (ref 150–400)
RBC: 5.27 MIL/uL (ref 4.22–5.81)
RDW: 13.4 % (ref 11.5–15.5)
WBC: 15.9 10*3/uL — ABNORMAL HIGH (ref 4.0–10.5)
nRBC: 0 % (ref 0.0–0.2)

## 2019-09-03 LAB — SARS CORONAVIRUS 2 (TAT 6-24 HRS): SARS Coronavirus 2: NEGATIVE

## 2019-09-03 LAB — OSMOLALITY: Osmolality: 332 mOsm/kg (ref 275–295)

## 2019-09-03 MED ORDER — SODIUM CHLORIDE 0.9 % IV SOLN: 250.0000 mL | INTRAVENOUS | Status: DC

## 2019-09-03 MED ORDER — INSULIN ASPART 100 UNIT/ML ~~LOC~~ SOLN
0.0000 [IU] | Freq: Every day | SUBCUTANEOUS | Status: DC
  Administered 2019-09-03: 5 [IU] via SUBCUTANEOUS

## 2019-09-03 MED ORDER — INSULIN STARTER KIT- PEN NEEDLES (ENGLISH)
1.0000 | Freq: Once | Status: AC
  Administered 2019-09-03: 1
  Filled 2019-09-03: qty 1

## 2019-09-03 MED ORDER — LACTATED RINGERS IV SOLN: INTRAVENOUS | Status: DC

## 2019-09-03 MED ORDER — NOREPINEPHRINE 4 MG/250ML-% IV SOLN
2.0000 ug/min | INTRAVENOUS | Status: DC
  Administered 2019-09-03: 2 ug/min via INTRAVENOUS
  Filled 2019-09-03: qty 250

## 2019-09-03 MED ORDER — SODIUM CHLORIDE 0.9 % IV BOLUS
500.0000 mL | Freq: Once | INTRAVENOUS | Status: AC
  Administered 2019-09-03: 500 mL via INTRAVENOUS

## 2019-09-03 MED ORDER — LIVING WELL WITH DIABETES BOOK: Freq: Once | Status: AC

## 2019-09-03 MED ORDER — INSULIN ASPART 100 UNIT/ML ~~LOC~~ SOLN
0.0000 [IU] | Freq: Three times a day (TID) | SUBCUTANEOUS | Status: DC
  Administered 2019-09-03 (×2): 3 [IU] via SUBCUTANEOUS
  Administered 2019-09-04: 8 [IU] via SUBCUTANEOUS
  Administered 2019-09-04 (×2): 5 [IU] via SUBCUTANEOUS

## 2019-09-03 MED ORDER — INSULIN GLARGINE 100 UNIT/ML ~~LOC~~ SOLN
35.0000 [IU] | Freq: Every day | SUBCUTANEOUS | Status: DC
  Administered 2019-09-03 – 2019-09-04 (×2): 35 [IU] via SUBCUTANEOUS
  Filled 2019-09-03 (×4): qty 0.35

## 2019-09-03 MED ORDER — LACTATED RINGERS IV BOLUS
1000.0000 mL | INTRAVENOUS | Status: AC
  Administered 2019-09-03 (×2): 1000 mL via INTRAVENOUS

## 2019-09-03 MED ORDER — CHLORHEXIDINE GLUCONATE CLOTH 2 % EX PADS
6.0000 | MEDICATED_PAD | Freq: Every day | CUTANEOUS | Status: DC
  Administered 2019-09-03 – 2019-09-04 (×2): 6 via TOPICAL

## 2019-09-03 MED ORDER — METHOCARBAMOL 500 MG PO TABS
500.0000 mg | ORAL_TABLET | Freq: Once | ORAL | Status: AC
  Administered 2019-09-03: 500 mg via ORAL
  Filled 2019-09-03: qty 1

## 2019-09-03 MED ORDER — NOREPINEPHRINE 4 MG/250ML-% IV SOLN: 0.0000 ug/min | INTRAVENOUS | Status: DC

## 2019-09-03 NOTE — Care Management Important Message (Signed)
Important Message  Patient Details  Name: Jason Andersen MRN: [id number] Date of Birth: January 11, 1946   Medicare Important Message Given:  Yes     Corey Harold 09/03/2019, 4:35 PM

## 2019-09-03 NOTE — Progress Notes (Addendum)
Inpatient Diabetes Program Recommendations  AACE/ADA: New Consensus Statement on Inpatient Glycemic Control   Target Ranges:  Prepandial:   less than 140 mg/dL      Peak postprandial:   less than 180 mg/dL (1-2 hours)      Critically ill patients:  140 - 180 mg/dL  Results for Jason Andersen, Jason Andersen (MRN [id number]) as of 09/03/2019 09:07  Ref. Range 09/03/2019 00:45 09/03/2019 01:35 09/03/2019 02:35 09/03/2019 03:33 09/03/2019 04:35 09/03/2019 05:33 09/03/2019 06:25 09/03/2019 07:44 09/03/2019 09:03  Glucose-Capillary Latest Ref Range: 70 - 99 mg/dL 239 (H) 183 (H) 222 (H) 243 (H) 243 (H) 210 (H) 205 (H) 176 (H) 187 (H)   Results for Jason Andersen, Jason Andersen (MRN [id number]) as of 09/03/2019 09:07  Ref. Range 09/02/2019 16:55  Glucose Latest Ref Range: 70 - 99 mg/dL 1,029 (HH)   Review of Glycemic Control  Diabetes history: DM2 Outpatient Diabetes medications: Metformin 1000 mg BID Current orders for Inpatient glycemic control: IV insulin  Inpatient Diabetes Program Recommendations:   Insulin at Transition from IV to SQ: Once MD is ready to transition patient from IV to SQ insulin, please consider ordering Lantus 35 units Q24H (based on 119 kg x 0.3units), CBGs Q4H, Novolog 0-9 units Q4H.  A1C: A1C in process.  NOTE: Noted consult for Diabetes Coordinator. Chart reviewed. Ordered: living well with DM book, insulin starter kit, and patient education by bedside RNs to educate on DM and insulin. Will plan to talk with patient today.  Addendum 09/03/19@14 :50-Spoke with patient over the phone about diabetes and home regimen for diabetes control. Patient reports being followed by PCP for diabetes management and currently taking Metformin 1000 mg 1-2 times per day as an outpatient for diabetes control.  Patient reports taking DM medications as prescribed and last seen his PCP April 2020.  Patient does not have a glucometer but notes his wife is going to buy him one.  Inquired about prior A1C and patient reports not being able to  recall last A1C value.  Current A1C in process but noted piror A1C was 7.8% in April 2020.  Discussed glucose and A1C goals. Discussed importance of checking CBGs and maintaining good CBG control to prevent long-term and short-term complications. Explained how hyperglycemia leads to damage within blood vessels which lead to the common complications seen with uncontrolled diabetes. Stressed to the patient the importance of improving glycemic control to prevent further complications from uncontrolled diabetes. Discussed impact of nutrition, exercise, stress, sickness, and medications on diabetes control.  Discussed carbohydrates, carbohydrate goals per day and meal, along with portion sizes. Encouraged patient to check glucose 4 times per day (before meals and at bedtime) and to keep a log book of glucose readings and DM medication taken which patient will need to take to doctor appointments. Explained how the doctor can use the log book to continue to make adjustments with DM medications if needed. Discussed Lantus and Novolog insulin as currently ordered; discussed each insulin and explained how it works. Discussed insulin storage, insulin injection sites, and site rotation. Patient reports that his wife is a Marine scientist and she is familiar with both insulin in vials and insulin pens. Encouraged patient to read over the insulin starter kit as it has step by step instructions on how to administer insulin with vial/syringe and insulin pens. Informed patient that RNs will continue to work with him on insulin administration and patient reports that nurse has already started insulin teaching with him.   Patient verbalized understanding of information discussed  and reports no further questions at this time related to diabetes.  Thanks, Barnie Alderman, RN, MSN, CDE Diabetes Coordinator Inpatient Diabetes Program [phone] (Team Pager from 8am to 5pm)

## 2019-09-03 NOTE — Progress Notes (Signed)
Pt arrived on unit about 1 hour ago. BP has started to trend downward. Trending 70s/40s. MD made aware. New orders received. Will continue to monitor patient.

## 2019-09-03 NOTE — Progress Notes (Signed)
Pt transferred to AP300 after report given to receiving RN. Pt transferred in wheelchair in stable condition. Patient meds from med drawer were also taken to 300.

## 2019-09-03 NOTE — Progress Notes (Signed)
CRITICAL VALUE ALERT  Critical Value:  Serum osmolality 332  Date & Time Notied:  09/03/2019 0848  Provider Notified: Dr. Kerry Hough  Orders Received/Actions taken: none at this time

## 2019-09-03 NOTE — Progress Notes (Signed)
PROGRESS NOTE    Jason Andersen  FSF:[id number] DOB: 1946-06-24 DOA: 09/02/2019 PCP: Berkley Harvey, NP    Brief Narrative:  74 year old male with a history of type 2 diabetes on Metformin, admitted to the hospital with diabetic ketoacidosis and acute kidney injury.  Started on intravenous insulin and IV fluids.  Clinically he is improving.   Assessment & Plan:   Active Problems:   DKA (diabetic ketoacidoses) ([place])   AKI (acute kidney injury) ([place])   Hypertension complicating diabetes ([place])   Type 2 diabetes mellitus with hyperlipidemia ([place])   1. Diabetic ketoacidosis.  Patient with history of type 2 diabetes.  Chronically on Metformin.  Presented with severe hyperglycemia and associated ketoacidosis.  Started on IV fluids and intravenous insulin.  Overall anion gap has improved.  Blood sugars are also in goal range.  Will transition to Lantus and sliding scale insulin.  A1c currently pending. 2. Acute kidney injury.  Related to volume depletion and DKA.  Baseline creatinine 1.2.  Continue on IV hydration and follow renal function. 3. Hypertension.  Chronically on lisinopril.  This is on hold in light of hypotension and acute kidney injury.  Resume once blood pressures have stabilized. 4. Hypotension.  Related to hypovolemia.  Patient is on low-dose Levophed.  Continue IV fluid resuscitation and wean off vasopressors as tolerated. 5. Hyperlipidemia.  Continue statin   DVT prophylaxis: Lovenox Code Status: Full code Family Communication: Discussed with patient Disposition Plan: Anticipate discharge home in the next 24 hours if blood sugars remain stable off of insulin infusion and blood pressure stabilizes off of vasopressors.   Consultants:     Procedures:     Antimicrobials:       Subjective: Patient is feeling better today.  No nausea or vomiting.  No shortness of breath.  Objective: Vitals:   09/03/19 0400 09/03/19 0500 09/03/19 0600 09/03/19 0700  BP: (!)  101/57 104/67 112/74 105/62  Pulse: 71 68 71 70  Resp: 13 14 17 14   Temp: (!) 97.4 F (36.3 C)     TempSrc: Axillary     SpO2: 97% 95% 96% 96%  Weight:      Height:        Intake/Output Summary (Last 24 hours) at 09/03/2019 1032 Last data filed at 09/03/2019 0300 Gross per 24 hour  Intake 853.86 ml  Output -  Net 853.86 ml   Filed Weights   09/02/19 1522 09/02/19 2249  Weight: 122.5 kg 119.4 kg    Examination:  General exam: Appears calm and comfortable  Respiratory system: Clear to auscultation. Respiratory effort normal. Cardiovascular system: S1 & S2 heard, RRR. No JVD, murmurs, rubs, gallops or clicks. No pedal edema. Gastrointestinal system: Abdomen is nondistended, soft and nontender. No organomegaly or masses felt. Normal bowel sounds heard. Central nervous system: Alert and oriented. No focal neurological deficits. Extremities: Symmetric 5 x 5 power. Skin: No rashes, lesions or ulcers Psychiatry: Judgement and insight appear normal. Mood & affect appropriate.     Data Reviewed: I have personally reviewed following labs and imaging studies  CBC: Recent Labs  Lab 09/02/19 1655 09/02/19 2312  WBC 13.8* 15.9*  NEUTROABS 11.0*  --   HGB 16.0 16.2  HCT 48.8 46.3  MCV 92.8 87.9  PLT 262 334   Basic Metabolic Panel: Recent Labs  Lab 09/02/19 1655 09/02/19 2027 09/02/19 2312 09/03/19 0357 09/03/19 0655  NA 124* 132* 138 136 138  K 5.6* 5.0 4.0 4.2 4.1  CL 85* 93* 98  100 101  CO2 23 24 23 23 26   GLUCOSE 1,029* 626* 285* 261* 212*  BUN 32* 31* 31* 33* 35*  CREATININE 1.69* 1.57* 1.75* 1.79* 1.79*  CALCIUM 9.4 9.4 9.9 9.4 9.5   GFR: Estimated Creatinine Clearance: 47.6 mL/min (A) (by C-G formula based on SCr of 1.79 mg/dL (H)). Liver Function Tests: Recent Labs  Lab 09/02/19 1655  AST 24  ALT 32  ALKPHOS 141*  BILITOT 1.1  PROT 6.8  ALBUMIN 3.9   No results for input(s): LIPASE, AMYLASE in the last 168 hours. No results for input(s): AMMONIA in  the last 168 hours. Coagulation Profile: No results for input(s): INR, PROTIME in the last 168 hours. Cardiac Enzymes: No results for input(s): CKTOTAL, CKMB, CKMBINDEX, TROPONINI in the last 168 hours. BNP (last 3 results) No results for input(s): PROBNP in the last 8760 hours. HbA1C: No results for input(s): HGBA1C in the last 72 hours. CBG: Recent Labs  Lab 09/03/19 0533 09/03/19 0625 09/03/19 0744 09/03/19 0903 09/03/19 1026  GLUCAP 210* 205* 176* 187* 189*   Lipid Profile: No results for input(s): CHOL, HDL, LDLCALC, TRIG, CHOLHDL, LDLDIRECT in the last 72 hours. Thyroid Function Tests: No results for input(s): TSH, T4TOTAL, FREET4, T3FREE, THYROIDAB in the last 72 hours. Anemia Panel: No results for input(s): VITAMINB12, FOLATE, FERRITIN, TIBC, IRON, RETICCTPCT in the last 72 hours. Sepsis Labs: No results for input(s): PROCALCITON, LATICACIDVEN in the last 168 hours.  Recent Results (from the past 240 hour(s))  MRSA PCR Screening     Status: None   Collection Time: 09/02/19 10:39 PM   Specimen: Nasopharyngeal  Result Value Ref Range Status   MRSA by PCR NEGATIVE NEGATIVE Final    Comment:        The GeneXpert MRSA Assay (FDA approved for NASAL specimens only), is one component of a comprehensive MRSA colonization surveillance program. It is not intended to diagnose MRSA infection nor to guide or monitor treatment for MRSA infections. Performed at [facility], [address] [place], [place] [address]          Radiology Studies: No results found.      Scheduled Meds: . Chlorhexidine Gluconate Cloth  6 each Topical Daily  . enoxaparin (LOVENOX) injection  40 mg Subcutaneous QHS  . insulin aspart  0-15 Units Subcutaneous TID WC  . insulin aspart  0-5 Units Subcutaneous QHS  . insulin glargine  35 Units Subcutaneous Daily  . insulin starter kit- pen needles  1 kit Other Once  . living well with diabetes book   Does not apply Once  . pravastatin   40 mg Oral QHS   Continuous Infusions: . sodium chloride Stopped (09/02/19 2348)  . sodium chloride    . dextrose 5 % and 0.45% NaCl 75 mL/hr at 09/02/19 2348  . insulin 5 Units/hr (09/03/19 0627)  . lactated ringers    . lactated ringers    . norepinephrine (LEVOPHED) Adult infusion 2 mcg/min (09/03/19 0227)     LOS: 1 day    Time spent: 56mns    JKathie Dike MD [facility]   If 7PM-7AM, please contact night-coverage [email or web address]  09/03/2019, 10:32 AM

## 2019-09-03 NOTE — Progress Notes (Signed)
Pt is asking for water to drink. Adv patient that he could not have any water at this time. Reminded patient why he was in the hospital. Offered fresh mouth swabs. Pt declined. Educated on the importance of mouth care. He verbalized understanding. He then said that we "torturing" him. I asked why did he think that? He said because I would not give him water and because I will not let him sit alone on the side of the bed in his room. Adv patient that I had stood in room with patient for 10 minutes while he "dangled" his feet. Adv that I cannot let him sit alone in case he falls. He stated he was not going to fall. Adv that he has fallen multiple times and that we are trying to keep him safe. Also, offered mouth swabs again. Patient decline. Helped patient back to bed. Turn bed alarm back on. Will continue to monitor patient.

## 2019-09-03 NOTE — Progress Notes (Signed)
CBG results not showing up on lab tab  0137 CBG reading 183 0235 CBG reading 222

## 2019-09-03 NOTE — Progress Notes (Signed)
After bolus completed, BP still soft. MD made aware. New orders were given to start Levo drip. Started. Will continue to monitor patient.

## 2019-09-03 NOTE — Progress Notes (Signed)
Pt placed on AUTO CPAP full face mask with 2L bleed in.  Pt tolerating well at this time.  RT will continue to monitor.

## 2019-09-04 DIAGNOSIS — E1165 Type 2 diabetes mellitus with hyperglycemia: Secondary | ICD-10-CM

## 2019-09-04 DIAGNOSIS — E1100 Type 2 diabetes mellitus with hyperosmolarity without nonketotic hyperglycemic-hyperosmolar coma (NKHHC): Secondary | ICD-10-CM

## 2019-09-04 LAB — BASIC METABOLIC PANEL
Anion gap: 9 (ref 5–15)
BUN: 24 mg/dL — ABNORMAL HIGH (ref 8–23)
CO2: 27 mmol/L (ref 22–32)
Calcium: 8.9 mg/dL (ref 8.9–10.3)
Chloride: 99 mmol/L (ref 98–111)
Creatinine, Ser: 1.19 mg/dL (ref 0.61–1.24)
GFR calc Af Amer: >60 mL/min (ref >60)
GFR calc non Af Amer: >60 mL/min (ref >60)
Glucose, Bld: 238 mg/dL — ABNORMAL HIGH (ref 70–99)
Potassium: 4.3 mmol/L (ref 3.5–5.1)
Sodium: 135 mmol/L (ref 135–145)

## 2019-09-04 LAB — GLUCOSE, CAPILLARY
Glucose-Capillary: 241 mg/dL — ABNORMAL HIGH (ref 70–99)
Glucose-Capillary: 287 mg/dL — ABNORMAL HIGH (ref 70–99)
Glucose-Capillary: 248 mg/dL — ABNORMAL HIGH (ref 70–99)

## 2019-09-04 MED ORDER — FINGERTIP PULSE OXIMETER MISC: 0 refills | Status: AC

## 2019-09-04 MED ORDER — BLOOD GLUCOSE MONITOR KIT: PACK | 0 refills | Status: AC

## 2019-09-04 MED ORDER — LISINOPRIL 10 MG PO TABS: 10.0000 mg | ORAL_TABLET | Freq: Every day | ORAL | Status: DC

## 2019-09-04 MED ORDER — PEN NEEDLES 31G X 8 MM MISC: 0 refills | Status: AC

## 2019-09-04 MED ORDER — ENOXAPARIN SODIUM 60 MG/0.6ML ~~LOC~~ SOLN: 60.0000 mg | Freq: Every day | SUBCUTANEOUS | Status: DC

## 2019-09-04 MED ORDER — INSULIN GLARGINE 100 UNIT/ML SOLOSTAR PEN: 45.0000 [IU] | PEN_INJECTOR | Freq: Every day | SUBCUTANEOUS | 11 refills | Status: AC

## 2019-09-04 MED ORDER — INSULIN GLARGINE 100 UNIT/ML ~~LOC~~ SOLN
10.0000 [IU] | SUBCUTANEOUS | Status: AC
  Administered 2019-09-04: 10 [IU] via SUBCUTANEOUS
  Filled 2019-09-04: qty 0.1

## 2019-09-04 MED ORDER — BLOOD GLUCOSE MONITOR KIT: PACK | 0 refills | Status: DC

## 2019-09-04 NOTE — Progress Notes (Signed)
Inpatient Diabetes Program Recommendations  AACE/ADA: New Consensus Statement on Inpatient Glycemic Control   Target Ranges:  Prepandial:   less than 140 mg/dL      Peak postprandial:   less than 180 mg/dL (1-2 hours)      Critically ill patients:  140 - 180 mg/dL   Results for DAIWIK, BUFFALO (MRN [id number]) as of 09/03/2019 09:07  Ref. Range 09/02/2019 16:55  Glucose Latest Ref Range: 70 - 99 mg/dL 1,102 (HH)   Review of Glycemic Control Results for KALADIN, NOSEWORTHY (MRN [id number]) as of 09/04/2019 09:08  Ref. Range 09/03/2019 16:35 09/03/2019 21:05 09/04/2019 07:46  Glucose-Capillary Latest Ref Range: 70 - 99 mg/dL 141 (H) 030 (H) 131 (H)   Diabetes history: DM2 Outpatient Diabetes medications: Metformin 1000 mg BID Current orders for Inpatient glycemic control:  Lantus 35 units Novolog 0-15 units tid + hs  Pending A1c  Inpatient Diabetes Program Recommendations:   Fasting glucose 240's -  Increase Lantus to 40 units -  Also consider Novolog 5 units tid meal coverage  Thanks, Christena Deem RN, MSN, BC-ADM Inpatient Diabetes Coordinator Team Pager [phone] (8a-5p)

## 2019-09-04 NOTE — Discharge Summary (Signed)
Physician Discharge Summary  Jason Andersen:[id number] DOB: 1946/05/12 DOA: 09/02/2019  PCP: Berkley Harvey, NP  Admit date: 09/02/2019 Discharge date: 09/04/2019  Admitted From: Home Disposition: Home  Recommendations for Outpatient Follow-up:  1. Follow up with PCP in 1-2 weeks 2. Please obtain BMP/CBC in one week 3. Hemoglobin A1c pending at the time of discharge.  Discharge Condition: Stable CODE STATUS: Full code Diet recommendation: Heart healthy, carb modified  Brief/Interim Summary: 74 year old male with history of type 2 diabetes on Metformin, was admitted to the hospital with diabetic ketoacidosis and acute kidney injury.  He was started on intravenous fluids and IV insulin.  Discharge Diagnoses:  Active Problems:   DKA (diabetic ketoacidoses) ([place])   AKI (acute kidney injury) ([place])   Hypertension complicating diabetes ([place])   Type 2 diabetes mellitus with hyperlipidemia ([place])  1. Diabetic ketoacidosis.  Patient with history of type 2 diabetes.  Chronically on Metformin.  Presented with severe hyperglycemia with a blood sugar greater than 1000 and associated ketoacidosis.  He was treated with intravenous insulin and IV fluids.  Overall anion gap has improved.  Blood sugars are also in goal range.  He was transitioned to Lantus with stabilization of blood sugar.  Blood sugars currently remain elevated, but his diabetic regimen will need to be further adjusted as an outpatient.    Last A1c was from almost 1 year ago.  He has not been checking his blood sugars at home.  He is being provided with a prescription for glucometer.  A1c currently pending. 2. Acute kidney injury.  Related to volume depletion and DKA.  Baseline creatinine 1.2.  Improved back to baseline with hydration. 3. Hypertension.  Chronically on lisinopril.    This was held in light of acute kidney injury.    Can likely resume after 1 week if creatinine remains stable. 4. Hypotension.  Related to hypovolemia.   Patient briefly required low-dose Levophed, but was quickly weaned off as his volume status improved.   5. Hyperlipidemia.  Continue statin  Discharge Instructions  Discharge Instructions    Diet - low sodium heart healthy   Complete by: As directed    Increase activity slowly   Complete by: As directed      Allergies as of 09/04/2019   No Known Allergies     Medication List    TAKE these medications   allopurinol 300 MG tablet Commonly known as: ZYLOPRIM Take 300 mg by mouth daily.   aspirin 81 MG tablet Take 81 mg by mouth daily.   blood glucose meter kit and supplies Kit Dispense based on patient and insurance preference. Use up to four times daily as directed. (FOR ICD-9 250.00, 250.01).   cholecalciferol 1000 units tablet Commonly known as: VITAMIN D Take 1,000 Units by mouth daily.   colchicine 0.6 MG tablet Take 0.6 mg by mouth daily.   Fingertip Pulse Oximeter Misc To be used as directed   furosemide 40 MG tablet Commonly known as: LASIX Take 40 mg by mouth 2 (two) times daily as needed for fluid.   Insulin Glargine 100 UNIT/ML Solostar Pen Commonly known as: LANTUS Inject 45 Units into the skin daily.   lisinopril 10 MG tablet Commonly known as: ZESTRIL Take 1 tablet (10 mg total) by mouth daily. Resume in 1 week What changed: additional instructions   loperamide 2 MG capsule Commonly known as: IMODIUM Take by mouth.   Magnesium 250 MG Tabs Take 1 tablet by mouth daily.   metFORMIN  500 MG tablet Commonly known as: GLUCOPHAGE Take 1,000 mg by mouth 2 (two) times daily with a meal. What changed: Another medication with the same name was removed. Continue taking this medication, and follow the directions you see here.   MULTIVITAL PO Take 1 tablet by mouth daily.   Pen Needles 31G X 8 MM Misc To be used as directed   pravastatin 40 MG tablet Commonly known as: PRAVACHOL Take 40 mg by mouth daily.       No Known  Allergies  Consultations:     Procedures/Studies:  No results found.    Subjective: Feels better.  No nausea, vomiting, shortness of breath or cough.  Discharge Exam: Vitals:   09/04/19 1320 09/04/19 1321 09/04/19 1510 09/04/19 1513  BP:    137/83  Pulse:   100 81  Resp:      Temp:    97.9 F (36.6 C)  TempSrc:    Oral  SpO2: 96% 97% 93%   Weight:      Height:        General: Pt is alert, awake, not in acute distress Cardiovascular: RRR, S1/S2 +, no rubs, no gallops Respiratory: CTA bilaterally, no wheezing, no rhonchi Abdominal: Soft, NT, ND, bowel sounds + Extremities: no edema, no cyanosis    The results of significant diagnostics from this hospitalization (including imaging, microbiology, ancillary and laboratory) are listed below for reference.     Microbiology: Recent Results (from the past 240 hour(s))  SARS CORONAVIRUS 2 (TAT 6-24 HRS) Nasopharyngeal Nasopharyngeal Swab     Status: None   Collection Time: 09/02/19  7:34 PM   Specimen: Nasopharyngeal Swab  Result Value Ref Range Status   SARS Coronavirus 2 NEGATIVE NEGATIVE Final    Comment: (NOTE) SARS-CoV-2 target nucleic acids are NOT DETECTED. The SARS-CoV-2 RNA is generally detectable in upper and lower respiratory specimens during the acute phase of infection. Negative results do not preclude SARS-CoV-2 infection, do not rule out co-infections with other pathogens, and should not be used as the sole basis for treatment or other patient management decisions. Negative results must be combined with clinical observations, patient history, and epidemiological information. The expected result is Negative. Fact Sheet for Patients: SugarRoll.be Fact Sheet for Healthcare Providers: https://www.woods-mathews.com/ This test is not yet approved or cleared by the [place] FDA and  has been authorized for detection and/or diagnosis of SARS-CoV-2 by FDA under  an Emergency Use Authorization (EUA). This EUA will remain  in effect (meaning this test can be used) for the duration of the COVID-19 declaration under Section 56 4(b)(1) of the Act, 21 U.S.C. section 360bbb-3(b)(1), unless the authorization is terminated or revoked sooner. Performed at [facility], [place] [address] [place], [place] [address]   MRSA PCR Screening     Status: None   Collection Time: 09/02/19 10:39 PM   Specimen: Nasopharyngeal  Result Value Ref Range Status   MRSA by PCR NEGATIVE NEGATIVE Final    Comment:        The GeneXpert MRSA Assay (FDA approved for NASAL specimens only), is one component of a comprehensive MRSA colonization surveillance program. It is not intended to diagnose MRSA infection nor to guide or monitor treatment for MRSA infections. Performed at [facility], [address], [place], [place] [address]      Labs: BNP (last 3 results) No results for input(s): BNP in the last 8760 hours. Basic Metabolic Panel: Recent Labs  Lab 09/03/19 0357 09/03/19 0655 09/03/19 1057  09/03/19 1452 09/04/19 0612  NA 136 138 138 135 135  K 4.2 4.1 5.0 4.4 4.3  CL 100 101 99 99 99  CO2 23 26 30 26 27   GLUCOSE 261* 212* 215* 178* 238*  BUN 33* 35* 36* 32* 24*  CREATININE 1.79* 1.79* 1.82* 1.56* 1.19  CALCIUM 9.4 9.5 9.4 9.1 8.9   Liver Function Tests: Recent Labs  Lab 09/02/19 1655  AST 24  ALT 32  ALKPHOS 141*  BILITOT 1.1  PROT 6.8  ALBUMIN 3.9   No results for input(s): LIPASE, AMYLASE in the last 168 hours. No results for input(s): AMMONIA in the last 168 hours. CBC: Recent Labs  Lab 09/02/19 1655 09/02/19 2312  WBC 13.8* 15.9*  NEUTROABS 11.0*  --   HGB 16.0 16.2  HCT 48.8 46.3  MCV 92.8 87.9  PLT 262 310   Cardiac Enzymes: No results for input(s): CKTOTAL, CKMB, CKMBINDEX, TROPONINI in the last 168 hours. BNP: Invalid input(s): POCBNP CBG: Recent Labs  Lab 09/03/19 1635 09/03/19 2105 09/04/19 0746 09/04/19 1118  09/04/19 1621  GLUCAP 161* 388* 241* 287* 248*   D-Dimer No results for input(s): DDIMER in the last 72 hours. Hgb A1c No results for input(s): HGBA1C in the last 72 hours. Lipid Profile No results for input(s): CHOL, HDL, LDLCALC, TRIG, CHOLHDL, LDLDIRECT in the last 72 hours. Thyroid function studies No results for input(s): TSH, T4TOTAL, T3FREE, THYROIDAB in the last 72 hours.  Invalid input(s): FREET3 Anemia work up No results for input(s): VITAMINB12, FOLATE, FERRITIN, TIBC, IRON, RETICCTPCT in the last 72 hours. Urinalysis    Component Value Date/Time   COLORURINE STRAW (A) 09/02/2019 1725   APPEARANCEUR CLEAR 09/02/2019 1725   LABSPEC 1.028 09/02/2019 1725   PHURINE 5.0 09/02/2019 1725   GLUCOSEU >=500 (A) 09/02/2019 1725   HGBUR NEGATIVE 09/02/2019 1725   BILIRUBINUR NEGATIVE 09/02/2019 1725   KETONESUR 5 (A) 09/02/2019 1725   PROTEINUR NEGATIVE 09/02/2019 1725   NITRITE NEGATIVE 09/02/2019 1725   LEUKOCYTESUR NEGATIVE 09/02/2019 1725   Sepsis Labs Invalid input(s): PROCALCITONIN,  WBC,  LACTICIDVEN Microbiology Recent Results (from the past 240 hour(s))  SARS CORONAVIRUS 2 (TAT 6-24 HRS) Nasopharyngeal Nasopharyngeal Swab     Status: None   Collection Time: 09/02/19  7:34 PM   Specimen: Nasopharyngeal Swab  Result Value Ref Range Status   SARS Coronavirus 2 NEGATIVE NEGATIVE Final    Comment: (NOTE) SARS-CoV-2 target nucleic acids are NOT DETECTED. The SARS-CoV-2 RNA is generally detectable in upper and lower respiratory specimens during the acute phase of infection. Negative results do not preclude SARS-CoV-2 infection, do not rule out co-infections with other pathogens, and should not be used as the sole basis for treatment or other patient management decisions. Negative results must be combined with clinical observations, patient history, and epidemiological information. The expected result is Negative. Fact Sheet for  Patients: SugarRoll.be Fact Sheet for Healthcare Providers: https://www.woods-mathews.com/ This test is not yet approved or cleared by the [place] FDA and  has been authorized for detection and/or diagnosis of SARS-CoV-2 by FDA under an Emergency Use Authorization (EUA). This EUA will remain  in effect (meaning this test can be used) for the duration of the COVID-19 declaration under Section 56 4(b)(1) of the Act, 21 U.S.C. section 360bbb-3(b)(1), unless the authorization is terminated or revoked sooner. Performed at [facility], [place] [address], [place], [place] [address]   MRSA PCR Screening     Status: None   Collection Time: 09/02/19  10:39 PM   Specimen: Nasopharyngeal  Result Value Ref Range Status   MRSA by PCR NEGATIVE NEGATIVE Final    Comment:        The GeneXpert MRSA Assay (FDA approved for NASAL specimens only), is one component of a comprehensive MRSA colonization surveillance program. It is not intended to diagnose MRSA infection nor to guide or monitor treatment for MRSA infections. Performed at [facility], [address], [place], [place] [address]      Time coordinating discharge: 79mns  SIGNED:   JKathie Dike MD  [facility] 09/04/2019, 6:11 PM   If 7PM-7AM, please contact night-coverage [email or web address]

## 2019-09-04 NOTE — Progress Notes (Signed)
Nsg Discharge Note  Admit Date:  09/02/2019 Discharge date: 09/04/2019   Arlana Pouch to be D/C'd Home per MD order.  AVS completed.  Patient's wife  able to verbalize understanding.  Discharge Medication: Allergies as of 09/04/2019   No Known Allergies     Medication List    TAKE these medications   allopurinol 300 MG tablet Commonly known as: ZYLOPRIM Take 300 mg by mouth daily.   aspirin 81 MG tablet Take 81 mg by mouth daily.   blood glucose meter kit and supplies Kit Dispense based on patient and insurance preference. Use up to four times daily as directed. (FOR ICD-9 250.00, 250.01).   cholecalciferol 1000 units tablet Commonly known as: VITAMIN D Take 1,000 Units by mouth daily.   colchicine 0.6 MG tablet Take 0.6 mg by mouth daily.   Fingertip Pulse Oximeter Misc To be used as directed   furosemide 40 MG tablet Commonly known as: LASIX Take 40 mg by mouth 2 (two) times daily as needed for fluid.   Insulin Glargine 100 UNIT/ML Solostar Pen Commonly known as: LANTUS Inject 45 Units into the skin daily.   lisinopril 10 MG tablet Commonly known as: ZESTRIL Take 1 tablet (10 mg total) by mouth daily. Resume in 1 week What changed: additional instructions   loperamide 2 MG capsule Commonly known as: IMODIUM Take by mouth.   Magnesium 250 MG Tabs Take 1 tablet by mouth daily.   metFORMIN 500 MG tablet Commonly known as: GLUCOPHAGE Take 1,000 mg by mouth 2 (two) times daily with a meal. What changed: Another medication with the same name was removed. Continue taking this medication, and follow the directions you see here.   MULTIVITAL PO Take 1 tablet by mouth daily.   Pen Needles 31G X 8 MM Misc To be used as directed   pravastatin 40 MG tablet Commonly known as: PRAVACHOL Take 40 mg by mouth daily.       Discharge Assessment: Vitals:   09/04/19 1510 09/04/19 1513  BP:  137/83  Pulse: 100 81  Resp:    Temp:  97.9 F (36.6 C)  SpO2: 93%     Skin clean, dry and intact without evidence of skin break down, no evidence of skin tears noted. IV catheter discontinued intact. Site without signs and symptoms of complications - no redness or edema noted at insertion site, patient denies c/o pain - only slight tenderness at site.  Dressing with slight pressure applied.  D/c Instructions-Education: Discharge instructions given to patient's wife Nunzio Banet with verbalized understanding. D/c education completed with patient's wife including follow up instructions, medication list, d/c activities limitations if indicated, with other d/c instructions as indicated by MD - patient's wife able to verbalize understanding, all questions fully answered. Patient instructed to return to ED, call 911, or call MD for any changes in condition.  Patient escorted via [place], and D/C home via private auto.  Berton Bon, RN 09/04/2019 5:33 PM

## 2019-09-04 NOTE — Progress Notes (Signed)
SATURATION QUALIFICATIONS: (This note is used to comply with regulatory documentation for home oxygen)  Patient Saturations on Room Air at Rest = 97%  Patient Saturations on Room Air while Ambulating = 93%  

## 2019-09-04 NOTE — Progress Notes (Signed)
IV reviewed by Jason Cancel, RN, 2x2 gauze and paper tape applied to site, patient tolerated well. AVS reviewed with patient and patient's wife, both verbalized understanding.  Patient to be taken to short stay lobby via wheelchair and transported home by his wife.

## 2019-09-04 NOTE — Progress Notes (Signed)
Attempted to educate and demonstrate the use of  insulin pen with patient and his wife who is a retired Engineer, civil (consulting). However, patient and his wife indicated that they received education and demonstration of insulin pen from a prior nurse. Both patient and his wife verbalized understanding of the use of insulin pen and needles.

## 2019-09-06 LAB — HEMOGLOBIN A1C
Hgb A1c MFr Bld: >15.5 % — ABNORMAL HIGH (ref 4.8–5.6)
Mean Plasma Glucose: >398 mg/dL

## 2019-09-12 ENCOUNTER — Ambulatory Visit: Payer: Medicare HMO | Attending: Internal Medicine

## 2019-09-12 DIAGNOSIS — Z23 Encounter for immunization: Secondary | ICD-10-CM

## 2019-09-12 NOTE — Progress Notes (Signed)
   Covid-19 Vaccination Clinic  Name:  Pawel Soules    MRN: [id number] DOB: 05/18/1946  09/12/2019  Mr. Duell was observed post Covid-19 immunization for 15 minutes without incidence. He was provided with Vaccine Information Sheet and instruction to access the V-Safe system.   Mr. Kerkhoff was instructed to call 911 with any severe reactions post vaccine: Marland Kitchen Difficulty breathing  . Swelling of your face and throat  . A fast heartbeat  . A bad rash all over your body  . Dizziness and weakness    Immunizations Administered    Name Date Dose VIS Date Route   Pfizer COVID-19 Vaccine 09/12/2019 10:29 AM 0.3 mL 07/09/2019 Intramuscular   Manufacturer: [employer], [employer]   Lot: [id number]   NDC: [address]-3

## 2019-10-12 ENCOUNTER — Ambulatory Visit: Payer: Medicare HMO | Attending: Internal Medicine

## 2019-10-12 DIAGNOSIS — Z23 Encounter for immunization: Secondary | ICD-10-CM

## 2019-10-12 NOTE — Progress Notes (Signed)
   Covid-19 Vaccination Clinic  Name:  Jason Andersen    MRN: [id number] DOB: 12-31-1945  10/12/2019  [name] was observed post Covid-19 immunization for 15 minutes without incident. He was provided with Vaccine Information Sheet and instruction to access the V-Safe system.   [name] was instructed to call 911 with any severe reactions post vaccine: Marland Kitchen Difficulty breathing  . Swelling of face and throat  . A fast heartbeat  . A bad rash all over body  . Dizziness and weakness   Immunizations Administered    Name Date Dose VIS Date Route   Pfizer COVID-19 Vaccine 10/12/2019 10:45 AM 0.3 mL 07/09/2019 Intramuscular   Manufacturer: [employer], [employer]   Lot: [id number]   NDC: [address]-4

## 2022-04-18 ENCOUNTER — Encounter: Payer: Self-pay | Admitting: Orthopedic Surgery

## 2022-04-18 ENCOUNTER — Ambulatory Visit (INDEPENDENT_AMBULATORY_CARE_PROVIDER_SITE_OTHER): Payer: Medicare HMO

## 2022-04-18 ENCOUNTER — Ambulatory Visit: Payer: Medicare HMO | Admitting: Orthopedic Surgery

## 2022-04-18 VITALS — BP 135/78 | HR 59 | Ht 69.0 in | Wt 284.0 lb

## 2022-04-18 DIAGNOSIS — M25561 Pain in right knee: Secondary | ICD-10-CM

## 2022-04-18 DIAGNOSIS — M1711 Unilateral primary osteoarthritis, right knee: Secondary | ICD-10-CM

## 2022-04-18 MED ORDER — IBUPROFEN 800 MG PO TABS: 800.0000 mg | ORAL_TABLET | Freq: Three times a day (TID) | ORAL | 1 refills | Status: AC | PRN

## 2022-04-18 NOTE — Progress Notes (Signed)
Chief Complaint  Patient presents with   Knee Pain    Right for a couple week s    HPI: 76 year old male cute onset of pain in his right knee he thought it was gout he took his gout medicine it did not improve, his dog and hit him from the medial side and placed a varus stress on his knee the pain increased so he went to urgent care after that visit and wearing a brace and taking some over-the-counter medications he seems to be improving now complains of pain in the posterior lateral aspect of the distal right thigh and proximal posterior aspect of the knee.  Most of the pain seems to be in the lateral hamstring.  Review of Systems  Constitutional:  Negative for chills and fever.  Cardiovascular:  Positive for leg swelling.  Neurological:  Negative for tingling.     Past Medical History:  Diagnosis Date   Diabetes mellitus    Edema leg-Asymmetric    Left greater than right   Gout    HTN (hypertension)    Junctional rhythm    Accelerated   Syncope    Vitamin D deficiency     BP 135/78   Pulse (!) 59   Ht 5\' 9"  (1.753 m)   Wt 284 lb (128.8 kg)   BMI 41.94 kg/m   The patient meets the AMA guidelines for Morbid (severe) obesity with a BMI > 40.0 and I have recommended weight loss.  General appearance: Well-developed well-nourished no gross deformities  Cardiovascular exam reveals severe pitting edema at the midportion of the tibia and down bilaterally with skin discoloration Neurologically no sensation loss or deficits or pathologic reflexes  Psychological: Awake alert and oriented x3 mood and affect normal  Skin no lacerations or ulcerations no nodularity no palpable masses, no erythema or nodularity, skin color changes related to venous stasis disease  Musculoskeletal: Gait no assistive devices  The patient has no pain or tenderness around his knee joint it is all in the lateral hamstring.  His ligaments feel normal his joint lines are nontender there is no  effusion  Imaging internal imaging shows arthritis medial compartment  A/P  Unclear etiology of pain other than the fact that the dog hit him and placed a varus stress on the knee  He is going to [place] [address] recommend aspirin twice a day until he comes back  Encounter Diagnosis  Name Primary?   Acute pain of right knee Yes   HEP  Follow-up as needed

## 2022-04-18 NOTE — Patient Instructions (Signed)
Apply heat 2 x a day for 20 minutes

## 2022-09-26 ENCOUNTER — Encounter: Payer: Self-pay | Admitting: Radiology
